# Patient Record
Sex: Male | Born: 1935 | Marital: Married | Smoking: Former smoker
Health system: Southern US, Community
[De-identification: ages and names within clinical notes are randomized; demographics above are authoritative.]

## PROBLEM LIST (undated history)

## (undated) DIAGNOSIS — G8929 Other chronic pain: Secondary | ICD-10-CM

## (undated) DIAGNOSIS — M1990 Unspecified osteoarthritis, unspecified site: Secondary | ICD-10-CM

## (undated) DIAGNOSIS — I471 Supraventricular tachycardia, unspecified: Secondary | ICD-10-CM

## (undated) DIAGNOSIS — N3941 Urge incontinence: Secondary | ICD-10-CM

## (undated) DIAGNOSIS — J301 Allergic rhinitis due to pollen: Secondary | ICD-10-CM

## (undated) DIAGNOSIS — M51369 Other intervertebral disc degeneration, lumbar region without mention of lumbar back pain or lower extremity pain: Secondary | ICD-10-CM

## (undated) DIAGNOSIS — M7700 Medial epicondylitis, unspecified elbow: Secondary | ICD-10-CM

## (undated) DIAGNOSIS — R2689 Other abnormalities of gait and mobility: Secondary | ICD-10-CM

## (undated) DIAGNOSIS — G4700 Insomnia, unspecified: Secondary | ICD-10-CM

## (undated) DIAGNOSIS — E785 Hyperlipidemia, unspecified: Secondary | ICD-10-CM

## (undated) DIAGNOSIS — N400 Enlarged prostate without lower urinary tract symptoms: Secondary | ICD-10-CM

## (undated) DIAGNOSIS — M5136 Other intervertebral disc degeneration, lumbar region: Secondary | ICD-10-CM

## (undated) DIAGNOSIS — M4306 Spondylolysis, lumbar region: Secondary | ICD-10-CM

## (undated) DIAGNOSIS — I10 Essential (primary) hypertension: Secondary | ICD-10-CM

## (undated) HISTORY — PX: BACK SURGERY: SHX140

## (undated) HISTORY — PX: CAROTID STENT: SHX1301

## (undated) HISTORY — DX: Hyperlipidemia, unspecified: E78.5

## (undated) HISTORY — DX: Essential (primary) hypertension: I10

---

## 2008-09-05 DIAGNOSIS — I2510 Atherosclerotic heart disease of native coronary artery without angina pectoris: Secondary | ICD-10-CM

## 2008-09-05 HISTORY — DX: Atherosclerotic heart disease of native coronary artery without angina pectoris: I25.10

## 2009-11-20 DIAGNOSIS — I5189 Other ill-defined heart diseases: Secondary | ICD-10-CM

## 2009-11-20 HISTORY — DX: Other ill-defined heart diseases: I51.89

## 2010-10-05 DIAGNOSIS — E782 Mixed hyperlipidemia: Secondary | ICD-10-CM | POA: Insufficient documentation

## 2010-10-05 DIAGNOSIS — I471 Supraventricular tachycardia, unspecified: Secondary | ICD-10-CM | POA: Insufficient documentation

## 2010-10-05 DIAGNOSIS — I2510 Atherosclerotic heart disease of native coronary artery without angina pectoris: Secondary | ICD-10-CM | POA: Insufficient documentation

## 2011-07-31 DIAGNOSIS — I10 Essential (primary) hypertension: Secondary | ICD-10-CM | POA: Insufficient documentation

## 2012-09-10 DIAGNOSIS — M7710 Lateral epicondylitis, unspecified elbow: Secondary | ICD-10-CM | POA: Insufficient documentation

## 2013-10-14 DIAGNOSIS — G8929 Other chronic pain: Secondary | ICD-10-CM | POA: Insufficient documentation

## 2013-12-06 DIAGNOSIS — M5126 Other intervertebral disc displacement, lumbar region: Secondary | ICD-10-CM | POA: Insufficient documentation

## 2014-02-14 DIAGNOSIS — M5116 Intervertebral disc disorders with radiculopathy, lumbar region: Secondary | ICD-10-CM | POA: Insufficient documentation

## 2015-07-01 DIAGNOSIS — M19041 Primary osteoarthritis, right hand: Secondary | ICD-10-CM | POA: Insufficient documentation

## 2018-02-05 DIAGNOSIS — M5126 Other intervertebral disc displacement, lumbar region: Secondary | ICD-10-CM | POA: Insufficient documentation

## 2019-07-03 DIAGNOSIS — M7918 Myalgia, other site: Secondary | ICD-10-CM | POA: Insufficient documentation

## 2019-07-03 DIAGNOSIS — M461 Sacroiliitis, not elsewhere classified: Secondary | ICD-10-CM | POA: Insufficient documentation

## 2020-01-16 ENCOUNTER — Other Ambulatory Visit: Payer: Self-pay | Admitting: Nurse Practitioner

## 2020-01-16 DIAGNOSIS — G8929 Other chronic pain: Secondary | ICD-10-CM

## 2020-01-29 ENCOUNTER — Ambulatory Visit: Payer: Self-pay | Admitting: Internal Medicine

## 2020-02-05 ENCOUNTER — Ambulatory Visit: Payer: Self-pay | Admitting: Internal Medicine

## 2020-02-07 ENCOUNTER — Ambulatory Visit
Admission: RE | Admit: 2020-02-07 | Discharge: 2020-02-07 | Disposition: A | Discharge status: Home / Self Care | Payer: Medicare HMO | Source: Ambulatory Visit | Attending: Nurse Practitioner | Admitting: Nurse Practitioner

## 2020-02-07 ENCOUNTER — Other Ambulatory Visit: Payer: Self-pay

## 2020-02-07 DIAGNOSIS — M545 Low back pain, unspecified: Secondary | ICD-10-CM | POA: Insufficient documentation

## 2020-02-07 DIAGNOSIS — G8929 Other chronic pain: Secondary | ICD-10-CM | POA: Diagnosis present

## 2020-06-14 NOTE — Progress Notes (Signed)
Cardiology Office Note  Date:  06/16/2020   ID:  Kenson, Groh [birth year], MRN [id number]  PCP:  [facility]   Chief Complaint  Patient presents with  . New Patient (Initial Visit)    Establish care with provider for hypertension. Just moved from [facility] and need a cardiologist closer to them. Medications verbally reviewed with patient.     HPI:  [name] is a 87 yo male with PMH of CAD  09/05/2008: Cardiac cath [facility]). EF 78%, 95% prox LAD, 95% mid LAD. RV marginal 90%. Status post successful PCI to prox/mid LAD with two overlapping DES.  hyperlipidemia hypertension.  back pain Who presents to establish care in the [place] office for his coronary disease, prior stenting  Discussed prior coronary intervention in 2010 Denies any further catheterization since that time Records from [facility] requested, reviewed in detail  Active, yoga 3x a week, stretch 2x a week Gardening Muscle building during the week  Denies any anginal symptoms with activity  Lives with his wife, she has cardiac disease and is going to establish in our office Presents today with his daughter Lives at [facility]  Notes indicating fluctuating Lipitor dose 20-40 On the 40 mg it would appear LDL 50, on the Lipitor 20 it would appear LDL 70  Remote stress test   echocardiogram was done in 2011 that shows normal ejection fraction.  EKG personally reviewed by myself on todays visit Shows sinus bradycardia rate 54 bpm no significant ST-T wave changes  Other past medical history reviewed a. 09/03/2008: Exercise treadmill test. EKG positive in inferior/anterolateral leads with ST elevation in AVR, V1. Reproduced 08/2008 chest pain. Max HR 126, 10.1 METS.  b. 09/05/2008: Cardiac cath [facility]). EF 78%, 95% prox LAD, 95% mid LAD. RV marginal 90%. Status post successful PCI to prox/mid LAD with two overlapping DES.  c. Completed three month cardiac rehab [facility]  d. 01/26/2009:  Exercise treadmill test [facility]). Negative adequate to max HR 133 bpm, 13.1 METS. No chest pain or arrhythmias.  D. 11/2009: ECHO: EF > 55%, trivial valvular disease  a. 01/06/2006:TC 245, TG 49, HDL 66, LDL 169.2  b. 02/02/2009: TC 143, TG 44, HDL 70, LDL 64  c. 11/16/2009: TC 148, TG 102, HDL 66, LDL 61  d. 08/17/10: TC 158, TG 49, HDL 75, LDL 73. E. 08/01/2011: TC 161, Tg 62, HDL 67, LDL 82 on Atorvastatin 40mg m F. April 2014 TC 161, TG 62, HDL 67, LDL 82 - Lipitor 20mg  G. 2017 TC 141, TG 89, HDL 49, LDL 74 - Lipitor 20mg  H. 2018 TC 151, TG 152, HDL 64, LDL 57 - Lipitor 20mg  I. 2020 TC 162, TG 84, HDL 82, LDL 82 - Lipitor 20mg   PMH:   has a past medical history of Hyperlipidemia and Hypertension.  PSH:    Past Surgical History:  Procedure Laterality Date  . BACK SURGERY    . CAROTID STENT     09/05/2008    Current Outpatient Medications  Medication Sig Dispense Refill  . aspirin 81 MG EC tablet Take by mouth.    2018 atorvastatin (LIPITOR) 20 MG tablet Take by mouth.    ibuprofen (ADVIL) 800 MG tablet Take by mouth.    lisinopril (ZESTRIL) 20 MG tablet Take 20 mg by mouth daily.    . metoprolol succinate (TOPROL-XL) 25 MG 24 hr tablet Take 1 tablet by mouth daily.    . nitroGLYCERIN (NITROSTAT) 0.4 MG SL  tablet 0.4 mg.     No current facility-administered medications for this visit.     Allergies:   Levonorgestrel-ethinyl estrad   Social History:  The patient  reports that he has quit smoking. He has never used smokeless tobacco.   Family History:   family history is not on file.    Review of Systems: Review of Systems  Constitutional: Negative.   HENT: Negative.   Respiratory: Negative.   Cardiovascular: Negative.   Gastrointestinal: Negative.   Musculoskeletal: Negative.   Neurological: Negative.   Psychiatric/Behavioral: Negative.   All other systems reviewed and are negative.   PHYSICAL EXAM: VS:  BP 124/84 (BP Location: Left Arm, Patient Position: Sitting,  Cuff Size: Normal)   Pulse (!) 54   Ht 5\' 8"  (1.727 m)   Wt 166 lb (75.3 kg)   SpO2 96%   BMI 25.24 kg/m  , BMI Body mass index is 25.24 kg/m. GEN: Well nourished, well developed, in no acute distress HEENT: normal Neck: no JVD, carotid bruits, or masses Cardiac: RRR; no murmurs, rubs, or gallops,no edema  Respiratory:  clear to auscultation bilaterally, normal work of breathing GI: soft, nontender, nondistended, + BS MS: no deformity or atrophy Skin: warm and dry, no rash Neuro:  Strength and sensation are intact Psych: euthymic mood, full affect   Recent Labs: No results found for requested labs within last 8760 hours.    Lipid Panel No results found for: CHOL, HDL, LDLCALC, TRIG    Wt Readings from Last 3 Encounters:  06/16/20 166 lb (75.3 kg)     ASSESSMENT AND PLAN:  Problem List Items Addressed This Visit      Cardiology Problems   Coronary artery disease involving native coronary artery of native heart - Primary   Relevant Medications   aspirin 81 MG EC tablet   atorvastatin (LIPITOR) 20 MG tablet   lisinopril (ZESTRIL) 20 MG tablet   metoprolol succinate (TOPROL-XL) 25 MG 24 hr tablet   nitroGLYCERIN (NITROSTAT) 0.4 MG SL tablet   Mixed hyperlipidemia   Relevant Medications   aspirin 81 MG EC tablet   atorvastatin (LIPITOR) 20 MG tablet   lisinopril (ZESTRIL) 20 MG tablet   metoprolol succinate (TOPROL-XL) 25 MG 24 hr tablet   nitroGLYCERIN (NITROSTAT) 0.4 MG SL tablet    Other Visit Diagnoses    Benign essential HTN       Relevant Medications   aspirin 81 MG EC tablet   atorvastatin (LIPITOR) 20 MG tablet   lisinopril (ZESTRIL) 20 MG tablet   metoprolol succinate (TOPROL-XL) 25 MG 24 hr tablet   nitroGLYCERIN (NITROSTAT) 0.4 MG SL tablet   Chronic back pain, unspecified back location, unspecified back pain laterality       Relevant Medications   aspirin 81 MG EC tablet   ibuprofen (ADVIL) 800 MG tablet     Coronary artery disease with stable  angina  Prior cardiac history reviewed, denies angina Very active at baseline Recommend he research Zetia to achieve goal LDL well below 70 Appears previously was on Lipitor 40 with LDL in the 50s, now only on Lipitor 20, long discussion with him and his daughter He is inclined not to do more medications, would prefer less medication  Essential hypertension Blood pressure is well controlled on today's visit. No changes made to the medications.  Hyperlipidemia As above will continue Lipitor 20, consider high-dose Lipitor or adding Zetia in the future     Total encounter time more than 60 minutes  Greater than 50% was spent in counseling and coordination of care with the patient    Signed, Esmond Plants, M.D., Ph.D. [place], [place]

## 2020-06-16 ENCOUNTER — Other Ambulatory Visit: Payer: Self-pay

## 2020-06-16 ENCOUNTER — Ambulatory Visit: Payer: Medicare HMO | Admitting: Cardiovascular Disease

## 2020-06-16 ENCOUNTER — Encounter: Payer: Self-pay | Admitting: Cardiovascular Disease

## 2020-06-16 VITALS — BP 124/84 | HR 54 | Ht 68.0 in | Wt 166.0 lb

## 2020-06-16 DIAGNOSIS — I25118 Atherosclerotic heart disease of native coronary artery with other forms of angina pectoris: Secondary | ICD-10-CM

## 2020-06-16 DIAGNOSIS — M549 Dorsalgia, unspecified: Secondary | ICD-10-CM | POA: Diagnosis not present

## 2020-06-16 DIAGNOSIS — E782 Mixed hyperlipidemia: Secondary | ICD-10-CM | POA: Diagnosis not present

## 2020-06-16 DIAGNOSIS — I10 Essential (primary) hypertension: Secondary | ICD-10-CM

## 2020-06-16 DIAGNOSIS — G8929 Other chronic pain: Secondary | ICD-10-CM

## 2020-06-16 MED ORDER — ATORVASTATIN CALCIUM 20 MG PO TABS
20.0000 mg | ORAL_TABLET | Freq: Every day | ORAL | 4 refills | Status: DC
Start: 2020-06-16 — End: 2020-12-01

## 2020-06-16 MED ORDER — METOPROLOL SUCCINATE ER 25 MG PO TB24: 25.0000 mg | ORAL_TABLET | Freq: Every day | ORAL | 4 refills | Status: DC

## 2020-06-16 MED ORDER — LISINOPRIL 20 MG PO TABS: 20.0000 mg | ORAL_TABLET | Freq: Every day | ORAL | 4 refills | Status: DC

## 2020-06-16 NOTE — Patient Instructions (Signed)
Research Zetia/ezetimibe   Medication Instructions:  No changes  If you need a refill on your cardiac medications before your next appointment, please call your pharmacy.    Lab work: No new labs needed   If you have labs (blood work) drawn today and your tests are completely normal, you will receive your results only by: Marland Kitchen MyChart Message (if you have MyChart) OR . A paper copy in the mail If you have any lab test that is abnormal or we need to change your treatment, we will call you to review the results.   Testing/Procedures: No new testing needed   Follow-Up: At [facility], you and your health needs are our priority.  As part of our continuing mission to provide you with exceptional heart care, we have created designated Provider Care Teams.  These Care Teams include your primary Cardiologist (physician) and Advanced Practice Providers (APPs -  Physician Assistants and Nurse Practitioners) who all work together to provide you with the care you need, when you need it.  . You will need a follow up appointment in 6 months  . Providers on your designated Care Team:   . Nicolasa Ducking, NP . Eula Listen, PA-C . Marisue Ivan, PA-C  Any Other Special Instructions Will Be Listed Below (If Applicable).  COVID-19 Vaccine Information can be found at: [email or web address] For questions related to vaccine distribution or appointments, please email [email or web address] or call [phone].

## 2020-11-30 NOTE — Progress Notes (Signed)
Cardiology Office Note  Date:  12/01/2020   ID:  Moris, Ratchford 1930-12-28, MRN [id number]  PCP:  [facility]   Chief Complaint  Patient presents with   6 month follow up     "Doing well." Medications reviewed by the patient verbally.     HPI:  [name] is a 87 yo male with PMH of CAD  09/05/2008: Cardiac cath [facility]). EF 78%, 95% prox LAD, 95% mid LAD. RV marginal 90%. Status post successful PCI to prox/mid LAD with two overlapping DES.  hyperlipidemia hypertension.  back pain Who presents for f/u of his coronary disease, prior stenting  "Bad back", chronic pain issues Poor balance Lives at [facility] Active, yoga 3x a week, stretch 2x a week Gardening Muscle building during the week  Daughter on the phone with him today, she lives in [place] Presents with his wife  Denies is any shortness of breath or chest pain on exertion  coronary intervention in 2010 No trouble since that time he reports   on Lipitor 40 daily Cholesterol at goal, total cholesterol 149 LDL 60  Remote stress test   echocardiogram was done in 2011 that shows normal ejection fraction.  EKG personally reviewed by myself on todays visit Shows sinus bradycardia rate 90 bpm no significant ST-T wave changes  Other past medical history reviewed a. 09/03/2008: Exercise treadmill test. EKG positive in inferior/anterolateral leads with ST elevation in AVR, V1. Reproduced 08/2008 chest pain. Max HR 126, 10.1 METS.  b. 09/05/2008: Cardiac cath [facility]). EF 78%, 95% prox LAD, 95% mid LAD. RV marginal 90%. Status post successful PCI to prox/mid LAD with two overlapping DES.  c. Completed three month cardiac rehab [place] at [facility]  d. 01/26/2009: Exercise treadmill test [facility]). Negative adequate to max HR 133 bpm, 13.1 METS. No chest pain or arrhythmias.  D. 11/2009: ECHO: EF > 55%, trivial valvular disease  a. 01/06/2006:TC 245, TG 49, HDL 66, LDL 169.2  b. 02/02/2009: TC 143, TG 44,  HDL 70, LDL 64  c. 11/16/2009: TC 148, TG 102, HDL 66, LDL 61  d. 08/17/10: TC 158, TG 49, HDL 75, LDL 73. E. 08/01/2011: TC 161, Tg 62, HDL 67, LDL 82 on Atorvastatin 40mg m F. April 2014 TC 161, TG 62, HDL 67, LDL 82 - Lipitor 20mg  G. 2017 TC 141, TG 89, HDL 49, LDL 74 - Lipitor 20mg  H. 2018 TC 151, TG 152, HDL 64, LDL 57 - Lipitor 20mg  I. 2020 TC 162, TG 84, HDL 82, LDL 82 - Lipitor 20mg   PMH:   has a past medical history of Hyperlipidemia and Hypertension.  PSH:    Past Surgical History:  Procedure Laterality Date   BACK SURGERY     CAROTID STENT     09/05/2008    Current Outpatient Medications  Medication Sig Dispense Refill   aspirin 81 MG EC tablet Take by mouth.     atorvastatin (LIPITOR) 20 MG tablet Take 1 tablet (20 mg total) by mouth daily. 90 tablet 4   ibuprofen (ADVIL) 800 MG tablet Take by mouth.     lisinopril (ZESTRIL) 20 MG tablet Take 1 tablet (20 mg total) by mouth daily. 90 tablet 4   metoprolol succinate (TOPROL-XL) 25 MG 24 hr tablet Take 1 tablet (25 mg total) by mouth daily. 90 tablet 4   nitroGLYCERIN (NITROSTAT) 0.4 MG SL tablet 0.4 mg.     No current facility-administered medications for this visit.  Allergies:   Levonorgestrel-ethinyl estrad   Social History:  The patient  reports that he has quit smoking. He has never used smokeless tobacco.   Family History:   family history is not on file.    Review of Systems: Review of Systems  Constitutional: Negative.   HENT: Negative.    Respiratory: Negative.    Cardiovascular: Negative.   Gastrointestinal: Negative.   Musculoskeletal: Negative.   Neurological: Negative.   Psychiatric/Behavioral: Negative.    All other systems reviewed and are negative.  PHYSICAL EXAM: VS:  BP 122/80 (BP Location: Left Arm, Patient Position: Sitting, Cuff Size: Normal)   Pulse 90   Ht 5\' 9"  (1.753 m)   Wt 164 lb (74.4 kg)   SpO2 98%   BMI 24.22 kg/m  , BMI Body mass index is 24.22 kg/m. Constitutional:   oriented to person, place, and time. No distress.  HENT:  Head: Grossly normal Eyes:  no discharge. No scleral icterus.  Neck: No JVD, no carotid bruits  Cardiovascular: Regular rate and rhythm, no murmurs appreciated Pulmonary/Chest: Clear to auscultation bilaterally, no wheezes or rails Abdominal: Soft.  no distension.  no tenderness.  Musculoskeletal: Normal range of motion Neurological:  normal muscle tone. Coordination normal. No atrophy Skin: Skin warm and dry Psychiatric: normal affect, pleasant   Recent Labs: No results found for requested labs within last 8760 hours.    Lipid Panel No results found for: CHOL, HDL, LDLCALC, TRIG    Wt Readings from Last 3 Encounters:  12/01/20 164 lb (74.4 kg)  06/16/20 166 lb (75.3 kg)     ASSESSMENT AND PLAN:  Problem List Items Addressed This Visit       Cardiology Problems   Coronary artery disease involving native coronary artery of native heart - Primary   Relevant Orders   EKG 12-Lead   Mixed hyperlipidemia   Other Visit Diagnoses     Benign essential HTN       Relevant Orders   EKG 12-Lead     Coronary artery disease with stable angina  Prior cardiac history reviewed, denies angina active at baseline Currently with no symptoms of angina. No further workup at this time. Continue current medication regimen. Recommended continued exercise at [facility]  Essential hypertension Blood pressure is well controlled on today's visit. No changes made to the medications.  Hyperlipidemia Cholesterol is at goal on the current lipid regimen. No changes to the medications were made.  Today's visit discussed with daughter on the phone from [facility]  Total encounter time more than 25 minutes  Greater than 50% was spent in counseling and coordination of care with the patient    Signed, [facility], M.D., Ph.D. [facility] [place], [place]

## 2020-12-01 ENCOUNTER — Encounter: Payer: Self-pay | Admitting: Cardiovascular Disease

## 2020-12-01 ENCOUNTER — Ambulatory Visit: Payer: Medicare HMO | Admitting: Cardiovascular Disease

## 2020-12-01 ENCOUNTER — Other Ambulatory Visit: Payer: Self-pay

## 2020-12-01 VITALS — BP 122/80 | HR 90 | Ht 69.0 in | Wt 164.0 lb

## 2020-12-01 DIAGNOSIS — I10 Essential (primary) hypertension: Secondary | ICD-10-CM

## 2020-12-01 DIAGNOSIS — I25118 Atherosclerotic heart disease of native coronary artery with other forms of angina pectoris: Secondary | ICD-10-CM

## 2020-12-01 DIAGNOSIS — E782 Mixed hyperlipidemia: Secondary | ICD-10-CM

## 2020-12-01 MED ORDER — LISINOPRIL 20 MG PO TABS: 20.0000 mg | ORAL_TABLET | Freq: Every day | ORAL | 3 refills | Status: DC

## 2020-12-01 MED ORDER — METOPROLOL SUCCINATE ER 25 MG PO TB24: 25.0000 mg | ORAL_TABLET | Freq: Every day | ORAL | 3 refills | Status: DC

## 2020-12-01 MED ORDER — ATORVASTATIN CALCIUM 20 MG PO TABS: 20.0000 mg | ORAL_TABLET | Freq: Every day | ORAL | 3 refills | Status: DC

## 2020-12-01 MED ORDER — NITROGLYCERIN 0.4 MG SL SUBL: SUBLINGUAL_TABLET | SUBLINGUAL | 3 refills | Status: DC

## 2020-12-01 NOTE — Patient Instructions (Addendum)
Medication Instructions:  No changes  If you need a refill on your cardiac medications before your next appointment, please call your pharmacy.   Lab work: No new labs needed  Testing/Procedures: No new testing needed  Follow-Up: At [facility], you and your health needs are our priority.  As part of our continuing mission to provide you with exceptional heart care, we have created designated Provider Care Teams.  These Care Teams include your primary Cardiologist (physician) and Advanced Practice Providers (APPs -  Physician Assistants and Nurse Practitioners) who all work together to provide you with the care you need, when you need it.  You will need a follow up appointment in 12 months  Providers on your designated Care Team:   Christopher Berge, NP Ryan Dunn, PA-C Jacquelyn Visser, PA-C Cadence Furth, PA-C  COVID-19 Vaccine Information can be found at: [email or web address] For questions related to vaccine distribution or appointments, please email [email or web address] or call [phone].    

## 2020-12-04 ENCOUNTER — Telehealth: Payer: Self-pay

## 2020-12-04 NOTE — Telephone Encounter (Signed)
[facility] sent fax directions for Nitroglycerin Rx because there weren't any included on the Rx). Spoke with patient to ask him which local pharmacy he would like his Rx for Nitroglycerin to be sent to however patient states he does not want to get this medication filled. Explained to patient that this medication is used on an emergency basis for chest pain but he still declined. Rx cancelled through [facility] via fax.

## 2021-06-27 ENCOUNTER — Other Ambulatory Visit: Payer: Self-pay

## 2021-06-27 ENCOUNTER — Emergency Department: Payer: Medicare HMO

## 2021-06-27 ENCOUNTER — Encounter: Payer: Self-pay | Admitting: Emergency Medicine

## 2021-06-27 ENCOUNTER — Emergency Department
Admission: EM | Admit: 2021-06-27 | Discharge: 2021-06-27 | Disposition: A | Discharge status: Home / Self Care | Payer: Medicare HMO | Attending: Emergency Medicine | Admitting: Emergency Medicine

## 2021-06-27 DIAGNOSIS — I10 Essential (primary) hypertension: Secondary | ICD-10-CM | POA: Insufficient documentation

## 2021-06-27 DIAGNOSIS — R350 Frequency of micturition: Secondary | ICD-10-CM | POA: Insufficient documentation

## 2021-06-27 DIAGNOSIS — I2510 Atherosclerotic heart disease of native coronary artery without angina pectoris: Secondary | ICD-10-CM | POA: Insufficient documentation

## 2021-06-27 DIAGNOSIS — R531 Weakness: Secondary | ICD-10-CM | POA: Insufficient documentation

## 2021-06-27 LAB — CBC WITH DIFFERENTIAL/PLATELET
Abs Immature Granulocytes: 0.03 10*3/uL (ref 0.00–0.07)
Basophils Absolute: 0 10*3/uL (ref 0.0–0.1)
Basophils Relative: 0 %
Eosinophils Absolute: 0 10*3/uL (ref 0.0–0.5)
Eosinophils Relative: 1 %
HCT: 43.3 % (ref 39.0–52.0)
Hemoglobin: 14.5 g/dL (ref 13.0–17.0)
Immature Granulocytes: 0 %
Lymphocytes Relative: 21 %
Lymphs Abs: 1.7 10*3/uL (ref 0.7–4.0)
MCH: 30.9 pg (ref 26.0–34.0)
MCHC: 33.5 g/dL (ref 30.0–36.0)
MCV: 92.3 fL (ref 80.0–100.0)
Monocytes Absolute: 0.5 10*3/uL (ref 0.1–1.0)
Monocytes Relative: 6 %
Neutro Abs: 5.8 10*3/uL (ref 1.7–7.7)
Neutrophils Relative %: 72 %
Platelets: 206 10*3/uL (ref 150–400)
RBC: 4.69 MIL/uL (ref 4.22–5.81)
RDW: 11.4 % — ABNORMAL LOW (ref 11.5–15.5)
WBC: 8 10*3/uL (ref 4.0–10.5)
nRBC: 0 % (ref 0.0–0.2)

## 2021-06-27 LAB — URINALYSIS, COMPLETE (UACMP) WITH MICROSCOPIC
Bilirubin Urine: NEGATIVE
Glucose, UA: NEGATIVE mg/dL
Hgb urine dipstick: NEGATIVE
Ketones, ur: NEGATIVE mg/dL
Leukocytes,Ua: NEGATIVE
Nitrite: NEGATIVE
Protein, ur: NEGATIVE mg/dL
Specific Gravity, Urine: 1.015 (ref 1.005–1.030)
Squamous Epithelial / HPF: NONE SEEN (ref 0–5)
pH: 8 (ref 5.0–8.0)

## 2021-06-27 LAB — COMPREHENSIVE METABOLIC PANEL
ALT: 20 U/L (ref 0–44)
AST: 28 U/L (ref 15–41)
Albumin: 3.8 g/dL (ref 3.5–5.0)
Alkaline Phosphatase: 64 U/L (ref 38–126)
Anion gap: 8 (ref 5–15)
BUN: 17 mg/dL (ref 8–23)
CO2: 26 mmol/L (ref 22–32)
Calcium: 8.8 mg/dL — ABNORMAL LOW (ref 8.9–10.3)
Chloride: 105 mmol/L (ref 98–111)
Creatinine, Ser: 0.77 mg/dL (ref 0.61–1.24)
GFR, Estimated: >60 mL/min (ref >60)
Glucose, Bld: 117 mg/dL — ABNORMAL HIGH (ref 70–99)
Potassium: 3.7 mmol/L (ref 3.5–5.1)
Sodium: 139 mmol/L (ref 135–145)
Total Bilirubin: 1.2 mg/dL (ref 0.3–1.2)
Total Protein: 7.2 g/dL (ref 6.5–8.1)

## 2021-06-27 NOTE — ED Triage Notes (Signed)
Patient to ED from [facility] at [facility] for generalized weakness per EMS. Patient states that he does not feel weak but that he has been going to the bathroom often during the night and that made his wife nervous. Patient denies difficulties urinating or burning. Aox4. Patient states he "wants to make his wife happy" by being seen.  ?

## 2021-06-27 NOTE — ED Provider Notes (Signed)
? ?[facility] ?Provider Note ? ? ? Event Date/Time  ? First MD Initiated Contact with Patient 06/27/21 [phone]   ?  (approximate) ? ? ?History  ? ?Weakness ? ? ?HPI ? ?Dennis Bond is a 88 y.o. male with past medical history of hypertension hyperlipidemia presents with an episode of altered mental status.  Patient's wife notes that around 5 AM today he got out of bed and seemed confused.  He went and sat down in the living room couch and then urinated on himself.  Patient is adamant that he was just in and out of sleep having difficulty sleeping may have been a little bit groggy.  Does not recall urinating on himself.  Says that typically he gets up multiple times per night and has urinary urgency with small amounts of output.  Denies headache nausea vomiting chest pain shortness of breath abdominal pain earlier other dysuria.  He denies fevers.  Denies numbness tingling or weakness. ?  ? ?Past Medical History:  ?Diagnosis Date  ? Hyperlipidemia   ? Hypertension   ? ? ?Patient Active Problem List  ? Diagnosis Date Noted  ? Gluteal pain 07/03/2019  ? Sacroiliitis (HCC) 07/03/2019  ? 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 Arthritis of hand, right 07/01/2015  ? 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  ? Displacement of intervertebral disc of lumbar region 12/06/2013  ? Chronic right-sided low back pain without sciatica 10/14/2013  ? Lateral epicondylitis 09/10/2012  ? Essential hypertension 07/31/2011  ? Coronary artery disease involving native coronary artery of native heart 10/05/2010  ? Mixed hyperlipidemia 10/05/2010  ? Paroxysmal supraventricular tachycardia (HCC) 10/05/2010  ? ? ? ?Physical Exam  ?Triage Vital Signs: ?ED Triage Vitals  ?Enc Vitals Group  ?   BP 06/27/21 0927 (!) 190/91  ?   Pulse Rate 06/27/21 0927 72  ?   Resp 06/27/21 0927 18  ?   Temp 06/27/21 0927 (!) 97.5 ?F (36.4 ?C)  ?   Temp Source 06/27/21 0927 Oral  ?   SpO2 06/27/21 0927 97 %  ?    Weight 06/27/21 0928 170 lb (77.1 kg)  ?   Height 06/27/21 0928 5\' 9"  (1.753 m)  ?   Head Circumference --   ?   Peak Flow --   ?   Pain Score 06/27/21 0928 0  ?   Pain Loc --   ?   Pain Edu? --   ?   Excl. in GC? --   ? ? ?Most recent vital signs: ?Vitals:  ? 06/27/21 1030 06/27/21 1100  ?BP: (!) 160/80 (!) 151/94  ?Pulse: 68 65  ?Resp: 20 15  ?Temp:    ?SpO2: 98% 97%  ? ? ? ?General: Awake, no distress.  ?CV:  Good peripheral perfusion.  ?Resp:  Normal effort.  ?Abd:  No distention.  Abdomen soft and nontender throughout ?Neuro:             Awake, Alert, Oriented x 3  ?Other:  Aox3, nml speech  ?PERRL, EOMI, face symmetric, nml tongue movement  ?5/5 strength in the BL upper and lower extremities  ?Sensation grossly intact in the BL upper and lower extremities  ?Finger-nose-finger intact BL ? ? ? ?ED Results / Procedures / Treatments  ?Labs ?(all labs ordered are listed, but only abnormal results are displayed) ?Labs Reviewed  ?URINALYSIS, COMPLETE (UACMP) WITH MICROSCOPIC - Abnormal; Notable for the following components:  ?    Result Value  ?  Color, Urine YELLOW (*)   ? APPearance CLEAR (*)   ? Bacteria, UA RARE (*)   ? All other components within normal limits  ?COMPREHENSIVE METABOLIC PANEL - Abnormal; Notable for the following components:  ? Glucose, Bld 117 (*)   ? Calcium 8.8 (*)   ? All other components within normal limits  ?CBC WITH DIFFERENTIAL/PLATELET - Abnormal; Notable for the following components:  ? RDW 11.4 (*)   ? All other components within normal limits  ? ? ? ?EKG ? ?EKG interpretation performed by myself: NSR, nml axis, nml intervals, no acute ischemic changes ? ? ? ?RADIOLOGY ?I reviewed the CT scan of the brain which does not show any acute intracranial process; agree with radiology report  ? ? ? ?PROCEDURES: ? ?Critical Care performed: No ? ?.1-3 Lead EKG Interpretation ?Performed by: Georga Hacking, MD ?Authorized by: Georga Hacking, MD  ? ?  Interpretation: normal   ?  ECG rate  assessment: normal   ?  Rhythm: sinus rhythm   ?  Ectopy: none   ?  Conduction: normal   ? ?The patient is on the cardiac monitor to evaluate for evidence of arrhythmia and/or significant heart rate changes. ? ? ?MEDICATIONS ORDERED IN ED: ?Medications - No data to display ? ? ?IMPRESSION / MDM / ASSESSMENT AND PLAN / ED COURSE  ?I reviewed the triage vital signs and the nursing notes. ?             ?               ? ?Differential diagnosis includes, but is not limited to, metabolic encephalopathy, UTI, hypertensive encephalopathy less likely, CVA ? ?Patient is an 88 year old male who presents after an episode of delirium today.  His wife tells me that he woke up around 5 AM walked to the couch which is not usual for him and then urinated on himself.  Patient endorsing significant symptoms of BPH with multiple nighttime awakening to urinate and small outputs.  He is adamant that he is back to baseline denies any acute neurologic symptoms and he is alert and oriented on exam and looks quite well with a nonfocal neurologic exam.  Somewhat hypertensive here but my suspicion for hypertensive encephalopathy is low given his normal mental status.  Will rule out UTI check basic labs and a CT head given his age.  Anticipate that if all within normal limits he could be discharged. ? ?Patient's labs including CBC CMP and UA are all reassuring.  No evidence of UTI.  CT head is without acute abnormality EKG nonischemic.  Given patient is back to baseline without any abnormality on neurologic exam I think he is appropriate for discharge. ? ?  ? ? ?FINAL CLINICAL IMPRESSION(S) / ED DIAGNOSES  ? ?Final diagnoses:  ?Urinary frequency  ? ? ? ?Rx / DC Orders  ? ?ED Discharge Orders   ? ? None  ? ?  ? ? ? ?Note:  This document was prepared using Dragon voice recognition software and may include unintentional dictation errors. ?  ?Georga Hacking, MD ?06/27/21 1115 ? ?

## 2021-06-27 NOTE — ED Notes (Signed)
Sidney Ace, MD at bedside assessing patient. ?

## 2021-06-27 NOTE — Discharge Instructions (Addendum)
Your blood work, urinary sample and CT of your brain were all reassuring.  You do not have a UTI.  Please follow-up with your primary care provider regarding your frequent urination as you likely have enlargement of your prostate.  There are medications that they could potentially start you on to help with this. ?

## 2021-08-03 ENCOUNTER — Ambulatory Visit: Payer: Medicare HMO | Admitting: Urology

## 2021-08-03 ENCOUNTER — Encounter: Payer: Self-pay | Admitting: Urology

## 2021-08-03 VITALS — BP 112/74 | HR 73 | Ht 69.0 in | Wt 170.0 lb

## 2021-08-03 DIAGNOSIS — Z125 Encounter for screening for malignant neoplasm of prostate: Secondary | ICD-10-CM | POA: Diagnosis not present

## 2021-08-03 DIAGNOSIS — N3941 Urge incontinence: Secondary | ICD-10-CM

## 2021-08-03 DIAGNOSIS — N138 Other obstructive and reflux uropathy: Secondary | ICD-10-CM

## 2021-08-03 DIAGNOSIS — N401 Enlarged prostate with lower urinary tract symptoms: Secondary | ICD-10-CM

## 2021-08-03 LAB — URINALYSIS, COMPLETE
Bilirubin, UA: NEGATIVE
Glucose, UA: NEGATIVE
Leukocytes,UA: NEGATIVE
Nitrite, UA: NEGATIVE
RBC, UA: NEGATIVE
Specific Gravity, UA: >1.03 — ABNORMAL HIGH (ref 1.005–1.030)
Urobilinogen, Ur: 1 mg/dL (ref 0.2–1.0)
pH, UA: 5.5 (ref 5.0–7.5)

## 2021-08-03 LAB — MICROSCOPIC EXAMINATION

## 2021-08-03 LAB — BLADDER SCAN AMB NON-IMAGING: Scan Result: 14

## 2021-08-03 NOTE — Progress Notes (Signed)
? ?08/03/2021 ?2:54 PM  ? ?Hilliard Borges Breunig ?Oct 27, 1930 ?[id number] ? ?Referring provider:  ?Mick Sell, MD ?[address] ?[place],  [place] [address] ?Chief Complaint  ?Patient presents with  ? Urinary Incontinence  ? ? ? ? ?HPI: ?Dennis Bond is a 88 y.o.male who presents today for further evaluation of urge incontinence.  ? ?He was seen in the ED on 06/27/2021. He was noted to have urinary accidents and confusion.UA was unremarkable besides some rare bacteria. Creatinine was normal at 0.77. He was not found to have a UTI and all labs were reassuring.  ? ?He  has documented when he drinks and how much he urinates and has brought it with today (see media). He reports that he usually wakes up to urinate and has 10-15 drops of urine.  He reports that he has urinary accidents when he has urinary urgency.  ? ?He denies seeing urologist in the past and medicine for his prostate. He reports his PCP prescribed oxybutynin but he did not finish the prescription and it had no improvement of his symptoms. His symptoms have been ongoing for about a year.  ? ?IPSS as below.   ? ? IPSS   ? ? Row Name 08/03/21 1300  ?  ?  ?  ? International Prostate Symptom Score  ? How often have you had the sensation of not emptying your bladder? Not at All    ? How often have you had to urinate less than every two hours? About half the time    ? How often have you found you stopped and started again several times when you urinated? About half the time    ? How often have you found it difficult to postpone urination? Less than half the time    ? How often have you had a weak urinary stream? More than half the time    ? How often have you had to strain to start urination? Not at All    ? How many times did you typically get up at night to urinate? 2 Times    ? Total IPSS Score 14    ?  ? Quality of Life due to urinary symptoms  ? If you were to spend the rest of your life with your urinary condition just the way it is now how would you  feel about that? Mostly Disatisfied    ? ?  ?  ? ?  ? ? ?Score:  ?1-7 Mild ?8-19 Moderate ?20-35 Severe ? ? ? ?PMH: ?Past Medical History:  ?Diagnosis Date  ? Hyperlipidemia   ? Hypertension   ? ? ?Surgical History: ?Past Surgical History:  ?Procedure Laterality Date  ? BACK SURGERY    ? CAROTID STENT    ? 09/05/2008  ? ? ?Home Medications:  ?Allergies as of 08/03/2021   ? ?   Reactions  ? Levonorgestrel-ethinyl Estrad Other (See Comments)  ? Spring allergy.  ? ?  ? ?  ?Medication List  ?  ? ?  ? Accurate as of Aug 03, 2021  2:54 PM. If you have any questions, ask your nurse or doctor.  ?  ?  ? ?  ? ?STOP taking these medications   ? ?nitroGLYCERIN 0.4 MG SL tablet ?Commonly known as: NITROSTAT ?Stopped by: Vanna Scotland, MD ?  ? ?  ? ?TAKE these medications   ? ?aspirin 81 MG EC tablet ?Take by mouth. ?  ?atorvastatin 20 MG tablet ?Commonly known as: LIPITOR ?Take  1 tablet (20 mg total) by mouth daily. ?  ?ibuprofen 800 MG tablet ?Commonly known as: ADVIL ?Take by mouth. ?  ?lisinopril 20 MG tablet ?Commonly known as: ZESTRIL ?Take 1 tablet (20 mg total) by mouth daily. ?  ?metoprolol succinate 25 MG 24 hr tablet ?Commonly known as: TOPROL-XL ?Take 1 tablet (25 mg total) by mouth daily. ?  ? ?  ? ? ?Allergies:  ?Allergies  ?Allergen Reactions  ? Levonorgestrel-Ethinyl Estrad Other (See Comments)  ?  Spring allergy.  ? ? ?Family History: ?No family history on file. ? ?Social History:  reports that he has quit smoking. He has never used smokeless tobacco. No history on file for alcohol use and drug use. ? ? ?Physical Exam: ?BP 112/74   Pulse 73   Ht 5\' 9"  (1.753 m)   Wt 170 lb (77.1 kg)   BMI 25.10 kg/m?   ?Constitutional:  Alert and oriented, No acute distress. ?HEENT: [place] AT, moist mucus membranes.  Trachea midline, no masses. ?Cardiovascular: No clubbing, cyanosis, or edema. ?Respiratory: Normal respiratory effort, no increased work of breathing. ?Skin: No rashes, bruises or suspicious lesions. ?Neurologic:  Grossly intact, no focal deficits, moving all 4 extremities. ?Psychiatric: Normal mood and affect. ? ?Laboratory Data: ? ?Lab Results  ?Component Value Date  ? CREATININE 0.77 06/27/2021  ? ? ?Urinalysis ?Unremarkable  ? ?Pertinent Imaging: ?Results for orders placed or performed in visit on 08/03/21  ?BLADDER SCAN AMB NON-IMAGING  ?Result Value Ref Range  ? Scan Result 14   ? ? ? ?Assessment & Plan:   ? ?BPH with urinary frequency/ urgency ?- We discussed different medication for symptoms such as flomax. We discussed that this medication can cause dizziness. We discussed the outlet procedure Urolift (he is seeing advertisements and is interested), we discussed that we would have to undergo a cystoscopy to see if he is a candidate for this procedure. He would like to proceed with cystoscopy/ TRUS rather than medications with possible side effects ?-Adequate emptying, no concern for overflow incontinence ?-Ua negative, no evidence of infection ? ?2. Prostate cancer screening  ?- He has not had PSA since 2016. Will draw PSA today primarily to rule out advanced metastatic prostate cancer as contributing factor to #1 ? ?Schedule cysto / TRUS ? ?2017 as a Tawni Millers for Neurosurgeon, MD.,have documented all relevant documentation on the behalf of Vanna Scotland, MD,as directed by  Vanna Scotland, MD while in the presence of Vanna Scotland, MD. ? ?I have reviewed the above documentation for accuracy and completeness, and I agree with the above.  ? ?Vanna Scotland, MD ? ?[facility] ?[address] ?[place], [place] ?[phone] ? ?

## 2021-08-03 NOTE — Patient Instructions (Signed)
Cystoscopy Cystoscopy is a procedure that is used to help diagnose and sometimes treat conditions that affect the lower urinary tract. The lower urinary tract includes the bladder and the urethra. The urethra is the tube that drains urine from the bladder. Cystoscopy is done using a thin, tube-shaped instrument with a light and camera at the end (cystoscope). The cystoscope may be hard or flexible, depending on the goal of the procedure. The cystoscope is inserted through the urethra, into the bladder. Cystoscopy may be recommended if you have: Urinary tract infections that keep coming back. Blood in the urine (hematuria). An inability to control when you urinate (urinary incontinence) or an overactive bladder. Unusual cells found in a urine sample. A blockage in the urethra, such as a urinary stone. Painful urination. An abnormality in the bladder found during an intravenous pyelogram (IVP) or CT scan. What are the risks? Generally, this is a safe procedure. However, problems may occur, including: Infection. Bleeding.  What happens during the procedure?  You will be given one or more of the following: A medicine to numb the area (local anesthetic). The area around the opening of your urethra will be cleaned. The cystoscope will be passed through your urethra into your bladder. Germ-free (sterile) fluid will flow through the cystoscope to fill your bladder. The fluid will stretch your bladder so that your health care provider can clearly examine your bladder walls. Your doctor will look at the urethra and bladder. The cystoscope will be removed The procedure may vary among health care providers  What can I expect after the procedure? After the procedure, it is common to have: Some soreness or pain in your urethra. Urinary symptoms. These include: Mild pain or burning when you urinate. Pain should stop within a few minutes after you urinate. This may last for up to a few days after the  procedure. A small amount of blood in your urine for several days. Feeling like you need to urinate but producing only a small amount of urine. Follow these instructions at home: General instructions Return to your normal activities as told by your health care provider.  Drink plenty of fluids after the procedure. Keep all follow-up visits as told by your health care provider. This is important. Contact a health care provider if you: Have pain that gets worse or does not get better with medicine, especially pain when you urinate lasting longer than 72 hours after the procedure. Have trouble urinating. Get help right away if you: Have blood clots in your urine. Have a fever or chills. Are unable to urinate. Summary Cystoscopy is a procedure that is used to help diagnose and sometimes treat conditions that affect the lower urinary tract. Cystoscopy is done using a thin, tube-shaped instrument with a light and camera at the end. After the procedure, it is common to have some soreness or pain in your urethra. It is normal to have blood in your urine after the procedure.  If you were prescribed an antibiotic medicine, take it as told by your health care provider.  This information is not intended to replace advice given to you by your health care provider. Make sure you discuss any questions you have with your health care provider. Document Revised: 03/06/2018 Document Reviewed: 03/06/2018 Elsevier Patient Education  2020 Elsevier Inc.   Transrectal Ultrasound  A transrectal ultrasound is a procedure that uses sound waves to create images of the prostate gland and nearby tissues. For this procedure, an ultrasound probe is placed   in the rectum. The probe sends sound waves through the wall of the rectum into the prostate gland. The prostate is a walnut-sized gland that is located below the bladder and in front of the rectum. The images show the size and shape of the prostate gland and nearby  structures. You may need this test if you have: Trouble urinating. Trouble getting your partner pregnant (infertility). An abnormal result from a prostate screening exam. Tell a health care provider about: Any allergies you have. All medicines you are taking, including vitamins, herbs, eye drops, creams, and over-the-counter medicines. Any bleeding problems you have. Any surgeries you have had. Any medical conditions you have. Any prostate infections you have had. What are the risks? Generally, this is a safe procedure. However, problems may occur, including: Discomfort during the procedure. Blood in your urine or sperm after the procedure. This may occur if a sample of tissue is taken to look at under a microscope (biopsy) during the procedure. What happens before the procedure? Your health care provider may instruct you to use an enema 1-4 hours before the procedure. Follow instructions from your health care provider about how to do the enema. Ask your health care provider about: Changing or stopping your regular medicines. This is especially important if you are taking diabetes medicines or blood thinners. Taking medicines such as aspirin and ibuprofen. These medicines can thin your blood. Do not take these medicines unless your health care provider tells you to take them. Taking over-the-counter medicines, vitamins, herbs, and supplements. What happens during the procedure? You will be asked to lie down on your left side on an exam table. You will bend your knees toward your chest. Gel will be put on a small probe that is about the width of a finger. The probe will be gently inserted into your rectum. You may have a feeling of fullness but should not feel pain. The probe will send signals to a computer that will create images. These will be displayed on a monitor that looks like a small television screen. The technician will slightly rotate the probe throughout the procedure. While  rotating the probe, he or she will view and capture images of the prostate gland and the surrounding structures from different angles. Your health care provider may take a biopsy sample of prostate tissue during the procedure. The images captured from the ultrasound will help guide the needle that is used to remove a sample of tissue. The sample will be sent to a lab for testing. The probe will be removed. The procedure may vary among health care providers and hospitals. What can I expect after the procedure? It is up to you to get the results of your procedure. Ask your health care provider, or the department that is doing the procedure, when your results will be ready. Keep all follow-up visits. This is important. Summary A transrectal ultrasound is a procedure that uses sound waves to create images of the prostate gland and nearby tissues. The images show the size and shape of the prostate gland and nearby structures. Before the procedure, ask your health care provider about changing or stopping your regular medicines. This is especially important if you are taking diabetes medicines or blood thinners. This information is not intended to replace advice given to you by your health care provider. Make sure you discuss any questions you have with your health care provider. Document Revised: 11/25/2020 Document Reviewed: 09/07/2020 Elsevier Patient Education  2023 Elsevier Inc.  

## 2021-08-04 LAB — PSA: Prostate Specific Ag, Serum: 5.1 ng/mL — ABNORMAL HIGH (ref 0.0–4.0)

## 2021-08-05 ENCOUNTER — Encounter: Payer: Self-pay | Admitting: Urology

## 2021-08-06 MED ORDER — TAMSULOSIN HCL 0.4 MG PO CAPS: 0.4000 mg | ORAL_CAPSULE | Freq: Every day | ORAL | 11 refills | Status: DC

## 2021-08-06 NOTE — Telephone Encounter (Signed)
Patient informed, sent in RX to [facility]. ?

## 2021-08-24 ENCOUNTER — Encounter: Payer: Self-pay | Admitting: Urology

## 2021-08-24 ENCOUNTER — Ambulatory Visit (INDEPENDENT_AMBULATORY_CARE_PROVIDER_SITE_OTHER): Payer: Medicare HMO | Admitting: Urology

## 2021-08-24 VITALS — BP 94/75 | HR 110 | Ht 69.0 in | Wt 170.0 lb

## 2021-08-24 DIAGNOSIS — N401 Enlarged prostate with lower urinary tract symptoms: Secondary | ICD-10-CM | POA: Diagnosis not present

## 2021-08-24 DIAGNOSIS — N3941 Urge incontinence: Secondary | ICD-10-CM

## 2021-08-24 DIAGNOSIS — R350 Frequency of micturition: Secondary | ICD-10-CM

## 2021-08-24 DIAGNOSIS — N138 Other obstructive and reflux uropathy: Secondary | ICD-10-CM | POA: Diagnosis not present

## 2021-08-24 NOTE — Progress Notes (Signed)
.    08/24/2021 CC: No chief complaint on file.    HPI: Dennis Bond is a 88 y.o. male with a personal history of BPH with urinary frequency/urgency who presents today for a diagnostic cystoscopy.   His most recent PSA on 08/04/2021 was 5.1   He is interested in UroLift for his BPH with urinary frequency.    There were no vitals filed for this visit. NED. A&Ox3.   No respiratory distress   Abd soft, NT, ND Normal phallus with bilateral descended testicles  Cystoscopy Procedure Note  Patient identification was confirmed, informed consent was obtained, and patient was prepped using Betadine solution.  Lidocaine jelly was administered per urethral meatus.     Pre-Procedure: - Inspection reveals a normal caliber ureteral meatus.  Procedure: The flexible cystoscope was introduced without difficulty - No urethral strictures/lesions are present. - Normal prostate bilobar coaptation  - Normal bladder neck - Bilateral ureteral orifices identified - Bladder mucosa  reveals no ulcers, tumors, or lesions - No bladder stones - Moderate to severe trabeculation  Retroflexion shows unremarkable   Post-Procedure: - Patient tolerated the procedure well   Prostate transrectal ultrasound sizing   Informed consent was obtained after discussing risks/benefits of the procedure.  A time out was performed to ensure correct patient identity.   Pre-Procedure: -Transrectal probe was placed without difficulty -Transrectal Ultrasound performed revealing a 37.02 gm prostate measuring 3.8  x 4.15 x 4.49 cm (length) -No significant hypoechoic or median lobe noted   Assessment/ Plan: BPH with outlet obstruction - Sequela of outlet obstruction with narrow prostate  -He has moderate improvement on Flomax .  - Continues to be interested in Urolift we discussed the risk and and the efficacy rate.  -We discussed the risks and complications of UroLift which include but are not limited to infection,  bleeding, and inadequate treatment with the Urolift procedure alone, anesthetic complications, among others.    No follow-ups on file.  I,Kailey Littlejohn,acting as a Neurosurgeon for Vanna Scotland, MD.,have documented all relevant documentation on the behalf of Vanna Scotland, MD,as directed by  Vanna Scotland, MD while in the presence of Vanna Scotland, MD.

## 2021-08-25 LAB — URINALYSIS, COMPLETE
Bilirubin, UA: NEGATIVE
Glucose, UA: NEGATIVE
Leukocytes,UA: NEGATIVE
Nitrite, UA: NEGATIVE
Protein,UA: NEGATIVE
Specific Gravity, UA: 1.025 (ref 1.005–1.030)
Urobilinogen, Ur: 0.2 mg/dL (ref 0.2–1.0)
pH, UA: 6 (ref 5.0–7.5)

## 2021-08-25 LAB — MICROSCOPIC EXAMINATION

## 2021-09-02 MED ORDER — TAMSULOSIN HCL 0.4 MG PO CAPS
0.4000 mg | ORAL_CAPSULE | Freq: Every day | ORAL | 11 refills | Status: DC
Start: 2021-09-02 — End: 2021-10-25

## 2021-09-02 NOTE — Addendum Note (Signed)
Addended by: Martha Clan on: 09/02/2021 09:35 AM   Modules accepted: Orders

## 2021-10-05 ENCOUNTER — Encounter: Payer: Self-pay | Admitting: Urology

## 2021-10-05 NOTE — Telephone Encounter (Signed)
Called patient to review symptoms-denies any new symptoms. Explained procedure, wishes to proceed with Urolift as mentioned at prior office visit. Offered office visit to discuss further, he declined. He wants to proceed-also noted he may need medications for after surgery. Advised scheduler will reach out to schedule. Voiced understanding.

## 2021-10-06 ENCOUNTER — Other Ambulatory Visit: Payer: Self-pay | Admitting: Urology

## 2021-10-06 ENCOUNTER — Telehealth: Payer: Self-pay

## 2021-10-06 DIAGNOSIS — N401 Enlarged prostate with lower urinary tract symptoms: Secondary | ICD-10-CM

## 2021-10-06 NOTE — Progress Notes (Signed)
[facility] Posting Form   Surgery Date/Time: Date: 11/01/2021  Surgeon: Dr. 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

## 2021-10-06 NOTE — Progress Notes (Signed)
Surgical Physician Order Form [facility]  * Scheduling expectation : Next Available  *Length of Case:   *Clearance needed: no  *Anticoagulation Instructions: Hold all anticoagulants  *Aspirin Instructions: Hold Aspirin  *Post-op visit Date/Instructions:  4-6 week w/PVR  *Diagnosis: BPH w/BOO  *Procedure:  Urolift     Additional orders: N/A  -Admit type: OUTpatient  -Anesthesia: MAC  -VTE Prophylaxis Standing Order SCD's       Other:   -Standing Lab Orders Per Anesthesia    Lab other: UA&Urine Culture  -Standing Test orders EKG/Chest x-ray per Anesthesia       Test other:   - Medications:  Ancef 2gm IV  -Other orders:  N/A

## 2021-10-06 NOTE — Telephone Encounter (Signed)
I spoke with Dennis Bond. We have discussed possible surgery dates and Monday August 7th, 2023 was agreed upon by all parties. Patient given information about surgery date, what to expect pre-operatively and post operatively.  We discussed that a Pre-Admission Testing office will be calling to set up the pre-op visit that will take place prior to surgery, and that these appointments are typically done over the phone with a Pre-Admissions RN.  Informed patient that our office will communicate any additional care to be provided after surgery. Patients questions or concerns were discussed during our call. Advised to call our office should there be any additional information, questions or concerns that arise. Patient verbalized understanding.

## 2021-10-18 ENCOUNTER — Encounter: Payer: Self-pay | Admitting: Urology

## 2021-10-19 ENCOUNTER — Other Ambulatory Visit: Payer: Medicare HMO

## 2021-10-20 ENCOUNTER — Other Ambulatory Visit: Payer: Medicare HMO

## 2021-10-20 DIAGNOSIS — N138 Other obstructive and reflux uropathy: Secondary | ICD-10-CM

## 2021-10-20 LAB — URINALYSIS, COMPLETE
Bilirubin, UA: NEGATIVE
Glucose, UA: NEGATIVE
Ketones, UA: NEGATIVE
Leukocytes,UA: NEGATIVE
Nitrite, UA: NEGATIVE
Protein,UA: NEGATIVE
RBC, UA: NEGATIVE
Specific Gravity, UA: 1.015 (ref 1.005–1.030)
Urobilinogen, Ur: 1 mg/dL (ref 0.2–1.0)
pH, UA: 7.5 (ref 5.0–7.5)

## 2021-10-20 LAB — MICROSCOPIC EXAMINATION: Bacteria, UA: NONE SEEN

## 2021-10-23 LAB — CULTURE, URINE COMPREHENSIVE

## 2021-10-25 ENCOUNTER — Encounter
Admission: RE | Admit: 2021-10-25 | Discharge: 2021-10-25 | Disposition: A | Discharge status: Home / Self Care | Payer: Medicare HMO | Source: Ambulatory Visit | Attending: Urology | Admitting: Urology

## 2021-10-25 ENCOUNTER — Other Ambulatory Visit: Payer: Self-pay

## 2021-10-25 ENCOUNTER — Telehealth: Payer: Self-pay | Admitting: *Deleted

## 2021-10-25 ENCOUNTER — Encounter: Payer: Self-pay | Admitting: Urology

## 2021-10-25 DIAGNOSIS — Z01812 Encounter for preprocedural laboratory examination: Secondary | ICD-10-CM | POA: Insufficient documentation

## 2021-10-25 HISTORY — DX: Unspecified osteoarthritis, unspecified site: M19.90

## 2021-10-25 LAB — COMPREHENSIVE METABOLIC PANEL
ALT: 23 U/L (ref 0–44)
AST: 27 U/L (ref 15–41)
Albumin: 4.1 g/dL (ref 3.5–5.0)
Alkaline Phosphatase: 61 U/L (ref 38–126)
Anion gap: 10 (ref 5–15)
BUN: 27 mg/dL — ABNORMAL HIGH (ref 8–23)
CO2: 23 mmol/L (ref 22–32)
Calcium: 9.2 mg/dL (ref 8.9–10.3)
Chloride: 105 mmol/L (ref 98–111)
Creatinine, Ser: 0.93 mg/dL (ref 0.61–1.24)
GFR, Estimated: >60 mL/min (ref >60)
Glucose, Bld: 89 mg/dL (ref 70–99)
Potassium: 4.3 mmol/L (ref 3.5–5.1)
Sodium: 138 mmol/L (ref 135–145)
Total Bilirubin: 1.3 mg/dL — ABNORMAL HIGH (ref 0.3–1.2)
Total Protein: 7.4 g/dL (ref 6.5–8.1)

## 2021-10-25 LAB — CBC
HCT: 42.8 % (ref 39.0–52.0)
Hemoglobin: 14.4 g/dL (ref 13.0–17.0)
MCH: 31 pg (ref 26.0–34.0)
MCHC: 33.6 g/dL (ref 30.0–36.0)
MCV: 92.2 fL (ref 80.0–100.0)
Platelets: 213 10*3/uL (ref 150–400)
RBC: 4.64 MIL/uL (ref 4.22–5.81)
RDW: 11.5 % (ref 11.5–15.5)
WBC: 7.5 10*3/uL (ref 4.0–10.5)
nRBC: 0 % (ref 0.0–0.2)

## 2021-10-25 NOTE — Patient Instructions (Addendum)
Your procedure is scheduled on: Report to the Registration Desk on the 1st floor of the [facility]. To find out your arrival time, please call [phone] between 1PM - 3PM on: If your arrival time is 6:00 am, do not arrive prior to that time as the [facility] entrance doors do not open until 6:00 am.  REMEMBER: Instructions that are not followed completely may result in serious medical risk, up to and including death; or upon the discretion of your surgeon and anesthesiologist your surgery may need to be rescheduled.  Do not eat food or drink any fluids after midnight the night before surgery.  No gum chewing, lozengers or hard candies.  TAKE ONLY THESE MEDICATIONS THE MORNING OF SURGERY WITH A SIP OF WATER:  - metoprolol succinate (TOPROL-XL) 25 MG 24 hr tablet   Stop taking beginning 10/27/21 , One week prior to surgery: Stop Anti-inflammatories (NSAIDS) such as Advil, Aleve, Ibuprofen, Motrin, Naproxen, Naprosyn and Aspirin based products such as Excedrin, Goodys Powder, BC Powder.  Stop ANY OVER THE COUNTER supplements until after surgery.  You may take Tylenol if needed for pain up until the day of surgery.  No Alcohol for 24 hours before or after surgery.  No Smoking including e-cigarettes for 24 hours prior to surgery.  No chewable tobacco products for at least 6 hours prior to surgery.  No nicotine patches on the day of surgery.  Do not use any "recreational" drugs for at least a week prior to your surgery.  Please be advised that the combination of cocaine and anesthesia may have negative outcomes, up to and including death. If you test positive for cocaine, your surgery will be cancelled.  On the morning of surgery brush your teeth with toothpaste and water, you may rinse your mouth with mouthwash if you wish. Do not swallow any toothpaste or mouthwash.  Do not wear jewelry, make-up, hairpins, clips or nail polish.  Do not wear lotions, powders, or perfumes.    Do not shave body from the neck down 48 hours prior to surgery just in case you cut yourself which could leave a site for infection.  Also, freshly shaved skin may become irritated if using the CHG soap.  Contact lenses, hearing aids and dentures may not be worn into surgery.  Do not bring valuables to the hospital. [facility] is not responsible for any missing/lost belongings or valuables.   Notify your doctor if there is any change in your medical condition (cold, fever, infection).  Wear comfortable clothing (specific to your surgery type) to the hospital.  After surgery, you can help prevent lung complications by doing breathing exercises.  Take deep breaths and cough every 1-2 hours. Your doctor may order a device called an Incentive Spirometer to help you take deep breaths. When coughing or sneezing, hold a pillow firmly against your incision with both hands. This is called "splinting." Doing this helps protect your incision. It also decreases belly discomfort.  If you are being admitted to the hospital overnight, leave your suitcase in the car. After surgery it may be brought to your room.  If you are being discharged the day of surgery, you will not be allowed to drive home. You will need a responsible adult (18 years or older) to drive you home and stay with you that night.   If you are taking public transportation, you will need to have a responsible adult (18 years or older) with you. Please confirm with your physician that  it is acceptable to use public transportation.   Please call the [facility] at [phone] if you have any questions about these instructions.  Surgery Visitation Policy:  Patients undergoing a surgery or procedure may have two family members or support persons with them as long as the person is not COVID-19 positive or experiencing its symptoms.   Inpatient Visitation:    Visiting hours are 7 a.m. to 8 p.m. Up to four visitors  are allowed at one time in a patient room, including children. The visitors may rotate out with other people during the day. One designated support person (adult) may remain overnight.

## 2021-10-25 NOTE — Telephone Encounter (Signed)
-----   Message from Karen Kitchens, NP sent at 10/25/2021  4:37 PM EDT ----- Regarding: Request for pre-operative cardiac clearance Request for pre-operative cardiac clearance:  1. What type of surgery is being performed?  CYSTOSCOPY WITH INSERTION OF UROLIFT  2. When is this surgery scheduled?  11/01/2021  3. Type of clearance being requested (medical, pharmacy, both). MEDICAL    4. Are there any medications that need to be held prior to surgery? ASA  5. Practice name and name of physician performing surgery?  Performing surgeon: Dr. Hollice Espy, MD Requesting clearance: Honor Loh, FNP-C    6. Anesthesia type (none, local, MAC, general)? MAC  7. What is the office phone and fax number?   Phone: [phone] Fax: [phone]  ATTENTION: Unable to create telephone message as per your standard workflow. Directed by [facility] providers to send requests for cardiac clearance to this pool for appropriate distribution to provider covering pre-operative clearances.   Honor Loh, MSN, APRN, FNP-C, CEN [facility]  Peri-operative Services Nurse Practitioner Phone: [phone] 10/25/21 4:37 PM

## 2021-10-26 ENCOUNTER — Telehealth: Payer: Self-pay

## 2021-10-26 ENCOUNTER — Telehealth: Payer: Self-pay | Admitting: *Deleted

## 2021-10-26 ENCOUNTER — Encounter: Payer: Self-pay | Admitting: Urology

## 2021-10-26 NOTE — Progress Notes (Signed)
Perioperative Services  Pre-Admission/Anesthesia Testing Clinical Review  Date: 10/28/21  Patient Demographics:  Name: Dennis Bond DOB:   23-Feb-1931 MRN:   [id number]  Planned Surgical Procedure(s):    Case: [id number] Date/Time: 11/01/21 1222   Procedure: CYSTOSCOPY WITH INSERTION OF UROLIFT   Anesthesia type: Monitor Anesthesia Care   Pre-op diagnosis: Benign Prostatic Hyperplasia with Bladder Outlet Obstruction   Location: [facility] OR ROOM 10 / [facility]   Surgeons: Dennis Scotland, MD   NOTE: Available PAT nursing documentation and vital signs have been reviewed. Clinical nursing staff has updated patient's PMH/PSHx, current medication list, and drug allergies/intolerances to ensure comprehensive history available to assist in medical decision making as it pertains to the aforementioned surgical procedure and anticipated anesthetic course. Extensive review of available clinical information performed. Dennis Bond PMH and PSHx updated with any diagnoses/procedures that  may have been inadvertently omitted during his intake with the pre-admission testing department's nursing staff.  Clinical Discussion:  Dennis Bond is a 88 y.o. male who is submitted for pre-surgical anesthesia review and clearance prior to him undergoing the above procedure.Patient is a Former Dennis Bond. Pertinent PMH includes: CAD, diastolic dysfunction, PSVT, HTN, HLD, BPH, OA, lumbar DDD, lumbar spondylosis, balance issues, insomnia.  Patient is followed by cardiology Dennis Milling, MD). He was last seen in the cardiology clinic on 12/01/2020; notes reviewed.  At the time of his clinic visit, patient doing well overall from a cardiovascular spectrum.  He denied any episodes of chest pain, short of breath, PND, orthopnea, palpitations, significant peripheral edema, vertiginous symptoms, or presyncope/syncope.  Patient with a past medical history significant for cardiovascular diagnoses.  Exercise treadmill test  performed on 09/03/2008 revealed ST elevation in the inferior and anterolateral leads.  Maximum heart rate achieved was 126 bpm.  Patient able to achieve a maximum workload of 10.1 METS.  Diagnostic left heart catheterization was performed on 09/05/2008 revealing a hyperdynamic left ventricular systolic function with an EF of 78%.  There was multivessel CAD; 95% proximal LAD, 95% mid LAD, and 90% RV marginal.  PCI of the proximal/mid LAD was performed placing 2 overlapping drug-eluting stents (type unknown).  Repeat exercise treadmill test performed on 01/26/2009 was found to be negative.  Patient achieved a heart rate of 133 bpm and a maximum workload of 13.1 METS.  TTE performed on 11/20/2009 revealed a normal left ventricular systolic function with an EF of >55%.  There was mild LVH and LAE.  Trivial atrial, mitral, and tricuspid valve regurgitation noted. Diastolic Doppler parameters consistent with abnormal relaxation (G1DD).  There was no evidence of a significant transvalvular gradient to suggest stenosis.  Blood pressure well controlled at 122/80 on currently prescribed lisinopril and metoprolol succinate therapies.  He is on atorvastatin for his HLD diagnosis and further ASCVD prevention.  He is not diabetic.  Probably has some balance issues and chronic back pain, patient reported to be very active at baseline.  He participates in yoga 3 times a week, muscle building exercises, and gardening.  That being said, per the DASI, patient felt to be able to achieve >/= 4 METS of activity without experiencing any type of angina/anginal equivalent symptoms.  No changes were made to his medication regimen.  Patient scheduled to follow-up with outpatient cardiology in 1 year or sooner if needed.  Dennis Bond is scheduled for an elective CYSTOSCOPY WITH INSERTION OF UROLIFT on 11/01/2021 with Dr. Vanna Scotland, MD.  Given patient's past medical history significant for cardiovascular diagnoses,  presurgical  cardiac clearance was sought by the PAT team. Per cardiology, "Mr. Dennis Bond's perioperative risk of a major cardiac event is 0.9% according to the Revised Cardiac Risk Index (RCRI). Therefore, he is at low risk for perioperative complications.   His functional capacity is good at 5.19 METs according to the Duke Activity Status Index (DASI). According to ACC/AHA guidelines, no further cardiovascular testing needed.  The patient may proceed to surgery at an ACCEPTABLE risk". In review of his medication reconciliation, it is noted the patient is on daily antiplatelet therapy.  He has been instructed on recommendations from his surgeon for holding his daily ASA dose for 5 days prior to his procedure with plans to restart as soon as postoperative bleeding respectively minimized by his primary attending surgeon.  Patient is aware that his last dose of ASA should be on 10/26/2021.  Patient denies previous perioperative complications with anesthesia in the past. In review of the available records, it is noted that patient underwent a general anesthetic course at [facility] ([facility]) in 01/2014 without documented complications.      08/24/2021    1:45 PM 08/03/2021    1:41 PM 06/27/2021   11:00 AM  Vitals with BMI  Height 5\' 9"  5\' 9"    Weight 170 lbs 170 lbs   BMI 25.09 25.09   Systolic 94 112 151  Diastolic 75 74 94  Pulse 110 73 65    Providers/Specialists:   NOTE: Primary physician provider listed below. Patient may have been seen by APP or partner within same practice.   PROVIDER ROLE / SPECIALTY LAST , MD Urology (Surgeon) 08/24/2021  Jaquelyn Bitter, MD Primary Care Provider 08/17/2021  Clydie Braun, MD Cardiology 12/01/2020   Allergies:  Levonorgestrel-ethinyl estrad  Current Home Medications:   No current facility-administered medications for this encounter.    aspirin 81 MG EC tablet   atorvastatin (LIPITOR) 20 MG tablet   ibuprofen (ADVIL) 800  MG tablet   lisinopril (ZESTRIL) 20 MG tablet   metoprolol succinate (TOPROL-XL) 25 MG 24 hr tablet   Multiple Vitamins-Minerals (PRESERVISION AREDS 2 PO)   History:   Past Medical History:  Diagnosis Date   Allergic rhinitis due to pollen    Arthritis    BPH (benign prostatic hyperplasia)    Chronic back pain    Coronary artery disease 09/05/2008   a.) LHC/PCI 09/05/2008: EF 78%, 95% pLAD, 95% mLAD, 90% RV marginal --> 2 overlapping stents (unknown type) placed to the p-mLAD   DDD (degenerative disc disease), lumbar    Diastolic dysfunction 11/20/2009   a.) TTE 11/20/2009: EF >55%, mild LVH, mild LAE, triv AR/MR/TR, G1DD   Hyperlipidemia    Hypertension    Insomnia    Lumbar spondylolysis    Medial epicondylitis    Osteoarthritis    Poor balance    PSVT (paroxysmal supraventricular tachycardia) (HCC)    Urge incontinence    Past Surgical History:  Procedure Laterality Date   BACK SURGERY     CAROTID STENT     09/05/2008   No family history on file. Social History   Tobacco Use   Smoking status: Former   Smokeless tobacco: Never  11/22/2009 Use: Never used    Pertinent Clinical Results:  LABS: Labs reviewed: Acceptable for surgery.  No visits with results within 3 Day(s) from this visit.  Latest known visit with results is:  Hospital Outpatient Visit on 10/25/2021  Component Date Value  Ref Range Status   Sodium 10/25/2021 138  135 - 145 mmol/L Final   Potassium 10/25/2021 4.3  3.5 - 5.1 mmol/L Final   Chloride 10/25/2021 105  98 - 111 mmol/L Final   CO2 10/25/2021 23  22 - 32 mmol/L Final   Glucose, Bld 10/25/2021 89  70 - 99 mg/dL Final   Glucose reference range applies only to samples taken after fasting for at least 8 hours.   BUN 10/25/2021 27 (H)  8 - 23 mg/dL Final   Creatinine, Ser 10/25/2021 0.93  0.61 - 1.24 mg/dL Final   Calcium 09/25/1599 9.2  8.9 - 10.3 mg/dL Final   Total Protein 09/32/3557 7.4  6.5 - 8.1 g/dL Final   Albumin  32/20/2542 4.1  3.5 - 5.0 g/dL Final   AST 70/62/3762 27  15 - 41 U/L Final   ALT 10/25/2021 23  0 - 44 U/L Final   Alkaline Phosphatase 10/25/2021 61  38 - 126 U/L Final   Total Bilirubin 10/25/2021 1.3 (H)  0.3 - 1.2 mg/dL Final   GFR, Estimated 10/25/2021 >60  >60 mL/min Final   Comment: (NOTE) Calculated using the CKD-EPI Creatinine Equation (2021)    Anion gap 10/25/2021 10  5 - 15 Final   Performed at [facility], [address] [place], [place] [address]   WBC 10/25/2021 7.5  4.0 - 10.5 K/uL Final   RBC 10/25/2021 4.64  4.22 - 5.81 MIL/uL Final   Hemoglobin 10/25/2021 14.4  13.0 - 17.0 g/dL Final   HCT 76/16/0737 42.8  39.0 - 52.0 % Final   MCV 10/25/2021 92.2  80.0 - 100.0 fL Final   MCH 10/25/2021 31.0  26.0 - 34.0 pg Final   MCHC 10/25/2021 33.6  30.0 - 36.0 g/dL Final   RDW 10/62/6948 11.5  11.5 - 15.5 % Final   Platelets 10/25/2021 213  150 - 400 K/uL Final   nRBC 10/25/2021 0.0  0.0 - 0.2 % Final   Performed at [facility], [address] [place], [place] [address]    ECG: Date: 06/27/2021 Time ECG obtained: 0946 AM Rate: 71 bpm Rhythm:  Sinus rhythm with PACs Axis (leads I and aVF): Normal Intervals: PR 191 ms. QRS 101 ms. QTc 419 ms. ST segment and T wave changes: No evidence of acute ST segment elevation or depression Comparison: Similar to previous tracing obtained on 12/01/2020   IMAGING / PROCEDURES: MR LUMBAR SPINE WO CONTRAST performed on 02/07/2020 L5 has some transitional features. L3-4: Previous posterior decompression. Disc degeneration with loss of height. Prominent anterior disc herniation which could be painful. Discogenic endplate edematous changes, particularly on the right, likely associated with back pain. Endplate osteophytes and bulging of the disc. Bilateral lateral recess and foraminal stenosis with some potential for neural compression. Lesser, grossly non-compressive degenerative changes at L1-2, L2-3 and  L4-5.  TRANSTHORACIC ECHOCARDIOGRAM performed on 11/20/2009 Normal left ventricular systolic function with mild LVH Diastolic Doppler parameters consistent with abnormal relaxation (G1DD). Normal right ventricular systolic function Trivial AR, MR, PR No TR Normal gradients; no valvular stenosis No pericardial effusion  Impression and Plan:  Dennis Bond has been referred for pre-anesthesia review and clearance prior to him undergoing the planned anesthetic and procedural courses. Available labs, pertinent testing, and imaging results were personally reviewed by me. This patient has been appropriately cleared by cardiology with an overall ACCEPTABLE risk of significant perioperative cardiovascular complications.  Based on clinical review performed today (10/28/21), barring any significant acute  changes in the patient's overall condition, it is anticipated that he will be able to proceed with the planned surgical intervention. Any acute changes in clinical condition may necessitate his procedure being postponed and/or cancelled. Patient will meet with anesthesia team (MD and/or CRNA) on the day of his procedure for preoperative evaluation/assessment. Questions regarding anesthetic course will be fielded at that time.   Pre-surgical instructions were reviewed with the patient during his PAT appointment and questions were fielded by PAT clinical staff. Patient was advised that if any questions or concerns arise prior to his procedure then he should return a call to PAT and/or his surgeon's office to discuss.  Quentin Mulling, MSN, APRN, FNP-C, CEN [facility]  Peri-operative Services Nurse Practitioner Phone: [phone] Fax: [phone] 10/28/21 5:02 PM  NOTE: This note has been prepared using Dragon dictation software. Despite my best ability to proofread, there is always the potential that unintentional transcriptional errors may still occur from this process.

## 2021-10-26 NOTE — Telephone Encounter (Signed)
I s/w the pt and he is agreeable to plan of care for tele pre op appt Thursday 10/28/21 @ 10:40. Pt was added on as his procedure is set for 11/01/21 Monday. Med rec and consent are done.    Pt had questions about what time is his procedure going to start as he states his son wants to be there,but he lives in [facility] and takes a little while to get here. I informed the pt that he needs to s/w Dr. Apolinar Junes office in regard to procedure start time. Pt seemed confused as to who to call. I assured the pt that I will send a message to the requesting office to reach out to the pt in regard to procedure time.      Patient Consent for Virtual Visit        Dennis Bond has provided verbal consent on 10/26/2021 for a virtual visit (video or telephone).   CONSENT FOR VIRTUAL VISIT FOR:  Dennis Bond  By participating in this virtual visit I agree to the following:  I hereby voluntarily request, consent and authorize [facility] and its employed or contracted physicians, Producer, television/film/video, nurse practitioners or other licensed health care professionals (the Practitioner), to provide me with telemedicine health care services (the "Services") as deemed necessary by the treating Practitioner. I acknowledge and consent to receive the Services by the Practitioner via telemedicine. I understand that the telemedicine visit will involve communicating with the Practitioner through live audiovisual communication technology and the disclosure of certain medical information by electronic transmission. I acknowledge that I have been given the opportunity to request an in-person assessment or other available alternative prior to the telemedicine visit and am voluntarily participating in the telemedicine visit.  I understand that I have the right to withhold or withdraw my consent to the use of telemedicine in the course of my care at any time, without affecting my right to future care or treatment, and that the  Practitioner or I may terminate the telemedicine visit at any time. I understand that I have the right to inspect all information obtained and/or recorded in the course of the telemedicine visit and may receive copies of available information for a reasonable fee.  I understand that some of the potential risks of receiving the Services via telemedicine include:  Delay or interruption in medical evaluation due to technological equipment failure or disruption; Information transmitted may not be sufficient (e.g. poor resolution of images) to allow for appropriate medical decision making by the Practitioner; and/or  In rare instances, security protocols could fail, causing a breach of personal health information.  Furthermore, I acknowledge that it is my responsibility to provide information about my medical history, conditions and care that is complete and accurate to the best of my ability. I acknowledge that Practitioner's advice, recommendations, and/or decision may be based on factors not within their control, such as incomplete or inaccurate data provided by me or distortions of diagnostic images or specimens that may result from electronic transmissions. I understand that the practice of medicine is not an exact science and that Practitioner makes no warranties or guarantees regarding treatment outcomes. I acknowledge that a copy of this consent can be made available to me via my patient portal [facility] MyChart), or I can request a printed copy by calling the office of [facility].    I understand that my insurance will be billed for this visit.   I have read or had this consent  read to me. I understand the contents of this consent, which adequately explains the benefits and risks of the Services being provided via telemedicine.  I have been provided ample opportunity to ask questions regarding this consent and the Services and have had my questions answered to my satisfaction. I give my  informed consent for the services to be provided through the use of telemedicine in my medical care

## 2021-10-26 NOTE — Telephone Encounter (Signed)
Spoke with Mr. Dennis Bond advised him that he may take AZO to help with the numbing of the urine per Dr. Apolinar Junes. Patient is worried that he will have a great deal of pain after Urolift, reassured patient that Dr. Apolinar Junes will give him medication to help the pain subside. We discussed in great detail

## 2021-10-26 NOTE — Telephone Encounter (Signed)
Her aspirin is prescribed by a noncardiac provider.  She will need to have recommendations for holding aspirin by prescribing provider.  Preoperative team, please contact this patient and set up a phone call appointment for further cardiac evaluation.  Thank you for your help.  Thomasene Ripple. Amika Tassin NP-C    10/26/2021, 10:26 AM [facility] [address] Office [phone] Fax [phone]

## 2021-10-26 NOTE — Telephone Encounter (Signed)
I s/w the pt and he is agreeable to plan of care for tele pre op appt Thursday 10/28/21 @ 10:40. Pt was added on as his procedure is set for 11/01/21 Monday. Med rec and consent are done.    Pt had questions about what time is his procedure going to start as he states his son wants to be there,but he lives in [facility] and takes a little while to get here. I informed the pt that he needs to s/w Dr. Apolinar Junes office in regard to procedure start time. Pt seemed confused as to who to call. I assured the pt that I will send a message to the requesting office to reach out to the pt in regard to procedure time.

## 2021-10-28 ENCOUNTER — Ambulatory Visit (INDEPENDENT_AMBULATORY_CARE_PROVIDER_SITE_OTHER): Payer: Medicare HMO | Admitting: Physician Assistant

## 2021-10-28 DIAGNOSIS — Z01810 Encounter for preprocedural cardiovascular examination: Secondary | ICD-10-CM

## 2021-10-28 NOTE — Progress Notes (Signed)
Virtual Visit via Telephone Note   Because of Dennis Bond's co-morbid illnesses, he is at least at moderate risk for complications without adequate follow up.  This format is felt to be most appropriate for this patient at this time.  The patient did not have access to video technology/had technical difficulties with video requiring transitioning to audio format only (telephone).  All issues noted in this document were discussed and addressed.  No physical exam could be performed with this format.  Please refer to the patient's chart for his consent to telehealth for [facility].  Evaluation Performed:  Preoperative cardiovascular risk assessment _____________   Date:  10/28/2021   Patient ID:  Dennis Bond, DOB 1930-06-04, MRN [id number] Patient Location:  Home Provider location:   Office  Primary Care Provider:  [facility] Primary Cardiologist:  None  Chief Complaint / Patient Profile   88 y.o. y/o male with a h/o CAD (cardiac cath 08/2008 at [facility] showed EF 78%, 95% proximal LAD, 95% mid LAD stenosis, RV marginal 90% stenosis, status post successful PCI to proximal/mid LAD with 2 overlapping DES), hyperlipidemia, hypertension, chronic back pain who is pending cystoscopy with insertion of UroLift and presents today for telephonic preoperative cardiovascular risk assessment.  Past Medical History    Past Medical History:  Diagnosis Date   Allergic rhinitis due to pollen    Arthritis    BPH (benign prostatic hyperplasia)    Chronic back pain    Coronary artery disease 09/05/2008   a.) LHC/PCI 09/05/2008: EF 78%, 95% pLAD, 95% mLAD, 90% RV marginal --> 2 overlapping stents (unknown type) placed to the p-mLAD   DDD (degenerative disc disease), lumbar    Diastolic dysfunction 11/20/2009   a.) TTE 11/20/2009: EF >55%, mild LVH, mild LAE, triv AR/MR/TR, G1DD   Hyperlipidemia    Hypertension    Insomnia    Lumbar spondylolysis    Medial epicondylitis    Osteoarthritis     Poor balance    PSVT (paroxysmal supraventricular tachycardia) (HCC)    Urge incontinence    Past Surgical History:  Procedure Laterality Date   BACK SURGERY     CAROTID STENT     09/05/2008    Allergies  Allergies  Allergen Reactions   Levonorgestrel-Ethinyl Estrad Other (See Comments)    Spring allergy.    History of Present Illness    Dennis Bond is a 88 y.o. male who presents via Web designer for a telehealth visit today.  Pt was last seen in cardiology clinic on 12/01/2020 by Dr. Mariah Milling.  At that time [facility] was doing well .  The patient is now pending procedure as outlined above. Since his last visit, he has been doing really well.  Last blood pressure reading was 120/90 which is excellent.  He walks a mile on the weekend and really only has back problems that are pretty bad.  No problems with his heart.  No chest pain or shortness of breath.  He states that they even killed the nerves in his back and he still has pain.  He mentioned having a recent issue with some prostate medicine he was taking.  He was getting dizzy especially in the morning.  He stopped taking this medication.  He says his blood pressure has been fine.  He drinks coffee and beer but not much water.  We discussed hydrating appropriately and this may help his dizziness.  He is very active and cooks every day.  He also has flower beds that he maintains outside his home at [facility] [address].  We discussed ASA.  He is already holding this medication.  I think from a cardiac standpoint it would be fine to hold this medicine 7 days prior to his procedure.  Please resume when medically safe to do so.  Of note, this medicine comes from his PCP.  Surgeon may have already discussed this with PCP.  Reports no shortness of breath nor dyspnea on exertion. Reports no chest pain, pressure, or tightness. No edema, orthopnea, PND. Reports no palpitations.     Home Medications    Prior to Admission medications    Medication Sig Start Date End Date Taking? Authorizing Provider  aspirin 81 MG EC tablet Take by mouth.    [provider]  atorvastatin (LIPITOR) 20 MG tablet Take 1 tablet (20 mg total) by mouth daily. 12/01/20   Antonieta Iba, MD  ibuprofen (ADVIL) 800 MG tablet Take by mouth. 06/25/19   [provider]  lisinopril (ZESTRIL) 20 MG tablet Take 1 tablet (20 mg total) by mouth daily. 12/01/20   Antonieta Iba, MD  metoprolol succinate (TOPROL-XL) 25 MG 24 hr tablet Take 1 tablet (25 mg total) by mouth daily. 12/01/20   Antonieta Iba, MD  Multiple Vitamins-Minerals (PRESERVISION AREDS 2 PO) Take by mouth daily.    [provider]    Physical Exam    Vital Signs:  Dennis Bond does not have vital signs available for review today. 120/90  Given telephonic nature of communication, physical exam is limited. AAOx3. NAD. Normal affect.  Speech and respirations are unlabored.  Accessory Clinical Findings    None  Assessment & Plan    1.  Preoperative Cardiovascular Risk Assessment: { Dennis Bond perioperative risk of a major cardiac event is 0.9% according to the Revised Cardiac Risk Index (RCRI).  Therefore, he is at low risk for perioperative complications.   His functional capacity is good at 5.19 METs according to the Duke Activity Status Index (DASI). Recommendations: According to ACC/AHA guidelines, no further cardiovascular testing needed.  The patient may proceed to surgery at acceptable risk.   Antiplatelet and/or Anticoagulation Recommendations: Aspirin can be held for 7 days prior to his surgery.  Please resume Aspirin post operatively when it is felt to be safe from a bleeding standpoint.   A copy of this note will be routed to requesting surgeon.  Time:   Today, I have spent 15 minutes with the patient with telehealth technology discussing medical history, symptoms, and management plan.     Sharlene Dory, PA-C  10/28/2021, 10:32 AM

## 2021-10-29 ENCOUNTER — Encounter: Payer: Self-pay | Admitting: Emergency Medicine

## 2021-10-29 ENCOUNTER — Emergency Department
Admission: EM | Admit: 2021-10-29 | Discharge: 2021-10-29 | Disposition: A | Discharge status: Home / Self Care | Payer: Medicare HMO | Attending: Emergency Medicine | Admitting: Emergency Medicine

## 2021-10-29 ENCOUNTER — Other Ambulatory Visit: Payer: Self-pay

## 2021-10-29 DIAGNOSIS — T85618A Breakdown (mechanical) of other specified internal prosthetic devices, implants and grafts, initial encounter: Secondary | ICD-10-CM

## 2021-10-29 DIAGNOSIS — K08531 Fractured dental restorative material with loss of material: Secondary | ICD-10-CM | POA: Diagnosis not present

## 2021-10-29 DIAGNOSIS — R6884 Jaw pain: Secondary | ICD-10-CM | POA: Diagnosis present

## 2021-10-29 NOTE — ED Triage Notes (Signed)
Patient has upper right false teeth being held in by one screw and patient is requesting it to be removed. States all dentist closed at this time.

## 2021-10-29 NOTE — Discharge Instructions (Addendum)
Temporary repair of your failed dental implants using Dermabond and metal brace. Follow-up with your Dental Provider.

## 2021-10-29 NOTE — ED Provider Notes (Signed)
[facility] Department Emergency Department Provider Note     Event Date/Time   First MD Initiated Contact with Patient 10/29/21 1754     (approximate)   History   Dental Injury   HPI  Dennis Bond is a 88 y.o. male presents to the ED for evaluation of a defective screw in his temporary dental implant to the right upper jaw.  Patient presents with some free movement of his first and second molar implants.  He is unable to make contact with a local dental provider, but plans to report to [facility] dental clinic on Monday.  He is asking for temporary fix to help secure his dental implant.     Physical Exam   Triage Vital Signs: ED Triage Vitals  Enc Vitals Group     BP 10/29/21 1711 (!) 137/94     Pulse Rate 10/29/21 1711 86     Resp 10/29/21 1711 16     Temp 10/29/21 1711 98.3 F (36.8 C)     Temp Source 10/29/21 1711 Oral     SpO2 10/29/21 1711 96 %     Weight 10/29/21 1712 170 lb (77.1 kg)     Height 10/29/21 1712 5\' 9"  (1.753 m)     Head Circumference --      Peak Flow --      Pain Score 10/29/21 1712 0     Pain Loc --      Pain Edu? --      Excl. in GC? --     Most recent vital signs: Vitals:   10/29/21 1711  BP: (!) 137/94  Pulse: 86  Resp: 16  Temp: 98.3 F (36.8 C)  SpO2: 96%    General Awake, no distress.  HEENT NCAT. PERRL. EOMI. No rhinorrhea. Mucous membranes are moist.  Patient with a partially attached temporary dental implant involving his first and second molars of the right upper mandible.  No focal gum swelling is appreciated. CV:  Good peripheral perfusion.  RESP:  Normal effort.  ABD:  No distention.    ED Results / Procedures / Treatments   Labs (all labs ordered are listed, but only abnormal results are displayed) Labs Reviewed - No data to display   EKG   RADIOLOGY   No results found.   PROCEDURES:  Critical Care performed: No  Procedures  Use Dermabond glue and mental nose please from a oxygen  mask to fashion and secure a brace for the upper molars.  Patient tolerated procedure without difficulty.  MEDICATIONS ORDERED IN ED: Medications - No data to display   IMPRESSION / MDM / ASSESSMENT AND PLAN / ED COURSE  I reviewed the triage vital signs and the nursing notes.                              Differential diagnosis includes, but is not limited to, dental fracture, dental implant failure  Patient's presentation is most consistent with acute complicated illness / injury requiring diagnostic workup.  Patient's diagnosis is consistent with failure of his very dental implant.  Patient is to follow up with his dental provider as needed or otherwise directed. Patient is given ED precautions to return to the ED for any worsening or new symptoms.     FINAL CLINICAL IMPRESSION(S) / ED DIAGNOSES   Final diagnoses:  Broken clasp of denture     Rx / DC Orders   ED  Discharge Orders     None        Note:  This document was prepared using Dragon voice recognition software and may include unintentional dictation errors.    Lissa Hoard, PA-C 10/30/21 0009    Sharman Cheek, MD 11/08/21 1336

## 2021-10-31 MED ORDER — CHLORHEXIDINE GLUCONATE 0.12 % MT SOLN: 15.0000 mL | Freq: Once | OROMUCOSAL | Status: AC

## 2021-10-31 MED ORDER — FAMOTIDINE 20 MG PO TABS: 20.0000 mg | ORAL_TABLET | Freq: Once | ORAL | Status: AC

## 2021-10-31 MED ORDER — ORAL CARE MOUTH RINSE: 15.0000 mL | Freq: Once | OROMUCOSAL | Status: AC

## 2021-10-31 MED ORDER — LACTATED RINGERS IV SOLN: INTRAVENOUS | Status: DC

## 2021-10-31 MED ORDER — CEFAZOLIN SODIUM-DEXTROSE 2-4 GM/100ML-% IV SOLN
2.0000 g | INTRAVENOUS | Status: AC
  Administered 2021-11-01: 2 g via INTRAVENOUS

## 2021-11-01 ENCOUNTER — Encounter: Payer: Self-pay | Admitting: Urology

## 2021-11-01 ENCOUNTER — Ambulatory Visit: Payer: Medicare HMO | Admitting: Urgent Care

## 2021-11-01 ENCOUNTER — Other Ambulatory Visit: Payer: Self-pay

## 2021-11-01 ENCOUNTER — Encounter
Admission: RE | Disposition: A | Discharge status: Home / Self Care | Payer: Self-pay | Source: Home / Self Care | Attending: Urology

## 2021-11-01 ENCOUNTER — Ambulatory Visit
Admission: RE | Admit: 2021-11-01 | Discharge: 2021-11-01 | Disposition: A | Discharge status: Home / Self Care | Payer: Medicare HMO | Attending: Urology | Admitting: Urology

## 2021-11-01 DIAGNOSIS — N138 Other obstructive and reflux uropathy: Secondary | ICD-10-CM | POA: Insufficient documentation

## 2021-11-01 DIAGNOSIS — N401 Enlarged prostate with lower urinary tract symptoms: Secondary | ICD-10-CM | POA: Insufficient documentation

## 2021-11-01 DIAGNOSIS — I2510 Atherosclerotic heart disease of native coronary artery without angina pectoris: Secondary | ICD-10-CM | POA: Diagnosis not present

## 2021-11-01 DIAGNOSIS — Z955 Presence of coronary angioplasty implant and graft: Secondary | ICD-10-CM | POA: Diagnosis not present

## 2021-11-01 DIAGNOSIS — N3289 Other specified disorders of bladder: Secondary | ICD-10-CM | POA: Insufficient documentation

## 2021-11-01 DIAGNOSIS — I10 Essential (primary) hypertension: Secondary | ICD-10-CM | POA: Diagnosis not present

## 2021-11-01 HISTORY — DX: Medial epicondylitis, unspecified elbow: M77.00

## 2021-11-01 HISTORY — PX: CYSTOSCOPY WITH INSERTION OF UROLIFT: SHX6678

## 2021-11-01 HISTORY — DX: Benign prostatic hyperplasia without lower urinary tract symptoms: N40.0

## 2021-11-01 HISTORY — DX: Urge incontinence: N39.41

## 2021-11-01 HISTORY — DX: Unspecified osteoarthritis, unspecified site: M19.90

## 2021-11-01 HISTORY — DX: Supraventricular tachycardia: I47.1

## 2021-11-01 HISTORY — DX: Other intervertebral disc degeneration, lumbar region without mention of lumbar back pain or lower extremity pain: M51.369

## 2021-11-01 HISTORY — DX: Supraventricular tachycardia, unspecified: I47.10

## 2021-11-01 HISTORY — DX: Spondylolysis, lumbar region: M43.06

## 2021-11-01 HISTORY — DX: Insomnia, unspecified: G47.00

## 2021-11-01 HISTORY — DX: Allergic rhinitis due to pollen: J30.1

## 2021-11-01 HISTORY — DX: Other intervertebral disc degeneration, lumbar region: M51.36

## 2021-11-01 HISTORY — DX: Other abnormalities of gait and mobility: R26.89

## 2021-11-01 HISTORY — DX: Other chronic pain: G89.29

## 2021-11-01 SURGERY — CYSTOSCOPY WITH INSERTION OF UROLIFT
Anesthesia: General | Site: Prostate

## 2021-11-01 MED ORDER — PROPOFOL 10 MG/ML IV BOLUS
INTRAVENOUS | Status: AC
  Filled 2021-11-01: qty 20

## 2021-11-01 MED ORDER — ONDANSETRON HCL 4 MG/2ML IJ SOLN: 4.0000 mg | Freq: Once | INTRAMUSCULAR | Status: DC | PRN

## 2021-11-01 MED ORDER — FENTANYL CITRATE (PF) 100 MCG/2ML IJ SOLN: 25.0000 ug | INTRAMUSCULAR | Status: DC | PRN

## 2021-11-01 MED ORDER — LIDOCAINE HCL URETHRAL/MUCOSAL 2 % EX GEL
CUTANEOUS | Status: DC | PRN
  Administered 2021-11-01: 1 via URETHRAL

## 2021-11-01 MED ORDER — OXYCODONE HCL 5 MG PO TABS: 5.0000 mg | ORAL_TABLET | Freq: Once | ORAL | Status: AC | PRN

## 2021-11-01 MED ORDER — OXYCODONE HCL 5 MG/5ML PO SOLN: 5.0000 mg | Freq: Once | ORAL | Status: AC | PRN

## 2021-11-01 MED ORDER — CHLORHEXIDINE GLUCONATE 0.12 % MT SOLN
OROMUCOSAL | Status: AC
  Administered 2021-11-01: 15 mL via OROMUCOSAL
  Filled 2021-11-01: qty 15

## 2021-11-01 MED ORDER — ACETAMINOPHEN 10 MG/ML IV SOLN: 1000.0000 mg | Freq: Once | INTRAVENOUS | Status: DC | PRN

## 2021-11-01 MED ORDER — CEFAZOLIN SODIUM-DEXTROSE 2-4 GM/100ML-% IV SOLN
INTRAVENOUS | Status: AC
  Filled 2021-11-01: qty 100

## 2021-11-01 MED ORDER — OXYCODONE HCL 5 MG PO TABS
ORAL_TABLET | ORAL | Status: AC
  Administered 2021-11-01: 5 mg via ORAL
  Filled 2021-11-01: qty 1

## 2021-11-01 MED ORDER — PROPOFOL 500 MG/50ML IV EMUL
INTRAVENOUS | Status: DC | PRN
  Administered 2021-11-01: 50 ug/kg/min via INTRAVENOUS

## 2021-11-01 MED ORDER — LIDOCAINE HCL (CARDIAC) PF 100 MG/5ML IV SOSY
PREFILLED_SYRINGE | INTRAVENOUS | Status: DC | PRN
  Administered 2021-11-01: 50 mg via INTRAVENOUS

## 2021-11-01 MED ORDER — FAMOTIDINE 20 MG PO TABS
ORAL_TABLET | ORAL | Status: AC
  Administered 2021-11-01: 20 mg via ORAL
  Filled 2021-11-01: qty 1

## 2021-11-01 SURGICAL SUPPLY — 16 items
BAG DRAIN SIEMENS DORNER NS (MISCELLANEOUS) ×2 IMPLANT
BAG DRN NS LF (MISCELLANEOUS) ×1
GAUZE 4X4 16PLY ~~LOC~~+RFID DBL (SPONGE) ×4 IMPLANT
GLOVE BIO SURGEON STRL SZ 6.5 (GLOVE) ×2 IMPLANT
GOWN STRL REUS W/ TWL LRG LVL3 (GOWN DISPOSABLE) ×2 IMPLANT
GOWN STRL REUS W/TWL LRG LVL3 (GOWN DISPOSABLE) ×4
KIT TURNOVER CYSTO (KITS) ×2 IMPLANT
MANIFOLD NEPTUNE II (INSTRUMENTS) ×2 IMPLANT
PACK CYSTO AR (MISCELLANEOUS) ×2 IMPLANT
SET CYSTO W/LG BORE CLAMP LF (SET/KITS/TRAYS/PACK) ×2 IMPLANT
SURGILUBE 2OZ TUBE FLIPTOP (MISCELLANEOUS) IMPLANT
SYSTEM UROLIFT 2 CART W/ HNDL (Male Continence) ×2 IMPLANT
SYSTEM UROLIFT 2 CARTRIDGE (Male Continence) ×5 IMPLANT
WATER STERILE IRR 1000ML POUR (IV SOLUTION) ×2 IMPLANT
WATER STERILE IRR 3000ML UROMA (IV SOLUTION) ×2 IMPLANT
WATER STERILE IRR 500ML POUR (IV SOLUTION) ×2 IMPLANT

## 2021-11-01 NOTE — H&P (Signed)
11/01/21  RRR CTAB   CC:     Chief Complaint  Patient presents with   Cysto   TRUS      HPI: Dennis Bond is a 88 y.o. male with a personal history of BPH with urinary frequency/urgency who presents today for a diagnostic cystoscopy.    His most recent PSA on 08/04/2021 was 5.1    He is interested in UroLift for his BPH with urinary frequency.    In the interim, he tried Flomax and had modest improvement but is still relatively symptomatic.        Vitals:    08/24/21 1345  BP: 94/75  Pulse: (!) 110    NED. A&Ox3.   No respiratory distress   Abd soft, NT, ND Normal phallus with bilateral descended testicles   Cystoscopy Procedure Note   Patient identification was confirmed, informed consent was obtained, and patient was prepped using Betadine solution.  Lidocaine jelly was administered per urethral meatus.       Pre-Procedure: - Inspection reveals a normal caliber ureteral meatus.   Procedure: The flexible cystoscope was introduced without difficulty - No urethral strictures/lesions are present. - Normal prostate bilobar coaptation  - Normal bladder neck - Bilateral ureteral orifices identified - Bladder mucosa  reveals no ulcers, tumors, or lesions - No bladder stones - Moderate to severe trabeculation   Retroflexion shows unremarkable     Post-Procedure: - Patient tolerated the procedure well  Past Medical History:  Diagnosis Date   Allergic rhinitis due to pollen    Arthritis    BPH (benign prostatic hyperplasia)    Chronic back pain    Coronary artery disease 09/05/2008   a.) LHC/PCI 09/05/2008: EF 78%, 95% pLAD, 95% mLAD, 90% RV marginal --> 2 overlapping stents (unknown type) placed to the p-mLAD   DDD (degenerative disc disease), lumbar    Diastolic dysfunction 11/20/2009   a.) TTE 11/20/2009: EF >55%, mild LVH, mild LAE, triv AR/MR/TR, G1DD   Hyperlipidemia    Hypertension    Insomnia    Lumbar spondylolysis    Medial epicondylitis     Osteoarthritis    Poor balance    PSVT (paroxysmal supraventricular tachycardia) (HCC)    Urge incontinence    Current Meds  Medication Sig   acetaminophen (TYLENOL) 500 MG tablet Take 1,000 mg by mouth every 6 (six) hours as needed.   aspirin 81 MG EC tablet Take by mouth.   atorvastatin (LIPITOR) 20 MG tablet Take 1 tablet (20 mg total) by mouth daily.   ibuprofen (ADVIL) 800 MG tablet Take by mouth.   lisinopril (ZESTRIL) 20 MG tablet Take 1 tablet (20 mg total) by mouth daily.   metoprolol succinate (TOPROL-XL) 25 MG 24 hr tablet Take 1 tablet (25 mg total) by mouth daily.   Multiple Vitamins-Minerals (PRESERVISION AREDS 2 PO) Take by mouth daily.   Allergies  Allergen Reactions   Levonorgestrel-Ethinyl Estrad Other (See Comments)    Spring allergy.        Prostate transrectal ultrasound sizing   Informed consent was obtained after discussing risks/benefits of the procedure.  A time out was performed to ensure correct patient identity.   Pre-Procedure: -Transrectal probe was placed without difficulty -Transrectal Ultrasound performed revealing a 37.02 gm prostate measuring 3.8  x 4.15 x 4.49 cm (length) -No significant hypoechoic or median lobe noted    Assessment/ Plan:   BPH with outlet obstruction - Sequela of outlet obstruction with significant bladder trabeculation consistent with obstruction from  BPH -He has moderate improvement on Flomax .  - Continues to be interested in Urolift; we discussed the risk and and the efficacy rate.  -We discussed the risks and complications of UroLift which include but are not limited to infection, bleeding, and inadequate treatment with the Urolift procedure alone, anesthetic complications, among others.     He will let us know if he would like to proceed   Tawni Millers as a scribe for Vanna Scotland, MD.,have documented all relevant documentation on the behalf of Vanna Scotland, MD,as directed by  Vanna Scotland, MD while  in the presence of Vanna Scotland, MD.   I have reviewed the above documentation for accuracy and completeness, and I agree with the above.    Vanna Scotland, MD

## 2021-11-01 NOTE — Transfer of Care (Signed)
Immediate Anesthesia Transfer of Care Note  Patient: Dennis Bond  Procedure(s) Performed: CYSTOSCOPY WITH INSERTION OF UROLIFT (Prostate)  Patient Location: PACU  Anesthesia Type:General  Level of Consciousness: drowsy and patient cooperative  Airway & Oxygen Therapy: Patient Spontanous Breathing and Patient connected to face mask oxygen  Post-op Assessment: Report given to RN and Post -op Vital signs reviewed and stable  Post vital signs: Reviewed and stable  Last Vitals:  Vitals Value Taken Time  BP    Temp    Pulse 58 11/01/21 1206  Resp 16 11/01/21 1206  SpO2 99 % 11/01/21 1206  Vitals shown include unvalidated device data.  Last Pain:  Vitals:   11/01/21 0936  TempSrc: Temporal  PainSc: 0-No pain         Complications: No notable events documented.

## 2021-11-01 NOTE — Op Note (Signed)
Preoperative diagnosis: BPH with obstructive symptomatology   Postoperative diagnosis: BPH with obstructive symptomatology   Principal procedure: Urolift procedure, with the placement of 6 implants.   Surgeon: Hollice Espy   Anesthesia: MAC   Complications: None   Drains: None   Estimated blood loss: < 5 mL   Indications: 88 year-old male with obstructive symptomatology secondary to BPH.  The patient's symptoms have progressed, and he has requested further management.  Management options including TURP with resection/ablation of the prostate as well as Urolift were discussed.  The patient has chosen to have a Urolift procedure.  He has been instructed to the procedure as well as risks and complications which include but are not limited to infection, bleeding, and inadequate treatment with the Urolift procedure alone, anesthetic complications, among others.  He understands these and desires to proceed.   Findings: Initially, some difficulty entering the prostatic fossa due to a relatively fixed prostate with significant angulation.  Used a flexible 16 French cystoscope and then transition to a 17 [place] rigid scope.    Prostatic urethra was obstructed secondary to bilobar hypertrophy.  The bladder was inspected circumferentially.  This showed bladder trabeculation otherwise unremarkable.   Description of procedure: The patient was properly identified in the holding area.  He received preoperative IV antibiotics.  He was taken to the operating room where MAC.  He is placed in the dorsolithotomy position.  Genitalia and perineum were prepped and draped.  Proper timeout was performed.   A 49F cystoscope was inserted into the bladder with findings as described above.  The 1st pair of implants were placed at the bladder neck ~1.5 cm from the bladder Neck. The 2nd pair of implants were placed at the level of the verumontanum.   A repeat cysto was performed and another pair of implants in  between the prior pairs.    A final cystoscopy was conducted first to inspect the location and state of each implant and second, to confirm the presence of a continuous anterior channel was present through the prostatic urethra with irrigation flow turned off.    6 implants were delivered in total.    Following this, the scope was removed.  After anesthetic reversal he was transported to the PACU in stable condition.  He tolerated the procedure well.   Plan: He will follow-up with me in 4 to 6 weeks for IPSS/PVR.

## 2021-11-01 NOTE — Anesthesia Postprocedure Evaluation (Signed)
Anesthesia Post Note  Patient: Dennis Bond  Procedure(s) Performed: CYSTOSCOPY WITH INSERTION OF UROLIFT (Prostate)  Patient location during evaluation: PACU Anesthesia Type: General Level of consciousness: awake and alert Pain management: pain level controlled Vital Signs Assessment: post-procedure vital signs reviewed and stable Respiratory status: spontaneous breathing, nonlabored ventilation, respiratory function stable and patient connected to nasal cannula oxygen Cardiovascular status: blood pressure returned to baseline and stable Postop Assessment: no apparent nausea or vomiting Anesthetic complications: no   No notable events documented.   Last Vitals:  Vitals:   11/01/21 1245 11/01/21 1255  BP: (!) 153/88 (!) 191/87  Pulse: (!) 51 60  Resp: 16 16  Temp: (!) 36.2 C (!) 36.1 C  SpO2: 100% 99%    Last Pain:  Vitals:   11/01/21 1255  TempSrc: Temporal  PainSc:                  Corinda Gubler

## 2021-11-01 NOTE — Anesthesia Preprocedure Evaluation (Addendum)
Anesthesia Evaluation  Patient identified by MRN, date of birth, ID band Patient awake    Reviewed: Allergy & Precautions, NPO status , Patient's Chart, lab work & pertinent test results  History of Anesthesia Complications Negative for: history of anesthetic complications  Airway Mallampati: II  TM Distance: >3 FB Neck ROM: Full    Dental  (+) Poor Dentition   Pulmonary neg pulmonary ROS, neg sleep apnea, neg COPD, Patient abstained from smoking.Not current smoker, former smoker,    Pulmonary exam normal breath sounds clear to auscultation       Cardiovascular Exercise Tolerance: Good METShypertension, Pt. on medications + CAD and + Cardiac Stents  (-) Past MI (-) dysrhythmias  Rhythm:Regular Rate:Normal - Systolic murmurs ? Exercise treadmill test performed on 09/03/2008 revealed ST elevation in the inferior and anterolateral leads.  Maximum heart rate achieved was 126 bpm.  Patient able to achieve a maximum workload of 10.1 METS.  ? Diagnostic left heart catheterization was performed on 09/05/2008 revealing a hyperdynamic left ventricular systolic function with an EF of 78%.  There was multivessel CAD; 95% proximal LAD, 95% mid LAD, and 90% RV marginal.  PCI of the proximal/mid LAD was performed placing 2 overlapping drug-eluting stents (type unknown).  ? Repeat exercise treadmill test performed on 01/26/2009 was found to be negative.  Patient achieved a heart rate of 133 bpm and a maximum workload of 13.1 METS.  ? TTE performed on 11/20/2009 revealed a normal left ventricular systolic function with an EF of >55%.  There was mild LVH and LAE.  Trivial atrial, mitral, and tricuspid valve regurgitation noted. Diastolic Doppler parameters consistent with abnormal relaxation (G1DD).  There was no evidence of a significant transvalvular gradient to suggest stenosis.   Neuro/Psych negative neurological ROS  negative psych ROS    GI/Hepatic neg GERD  ,(+)     (-) substance abuse  ,   Endo/Other  neg diabetes  Renal/GU negative Renal ROS     Musculoskeletal  (+) Arthritis ,   Abdominal   Peds  Hematology   Anesthesia Other Findings Past Medical History: No date: Allergic rhinitis due to pollen No date: Arthritis No date: BPH (benign prostatic hyperplasia) No date: Chronic back pain 09/05/2008: Coronary artery disease     Comment:  a.) LHC/PCI 09/05/2008: EF 78%, 95% pLAD, 95% mLAD, 90%               RV marginal --> 2 overlapping stents (unknown type)               placed to the p-mLAD No date: DDD (degenerative disc disease), lumbar 11/20/2009: Diastolic dysfunction     Comment:  a.) TTE 11/20/2009: EF >55%, mild LVH, mild LAE, triv               AR/MR/TR, G1DD No date: Hyperlipidemia No date: Hypertension No date: Insomnia No date: Lumbar spondylolysis No date: Medial epicondylitis No date: Osteoarthritis No date: Poor balance No date: PSVT (paroxysmal supraventricular tachycardia) (HCC) No date: Urge incontinence  Reproductive/Obstetrics                            Anesthesia Physical Anesthesia Plan  ASA: 3  Anesthesia Plan: General   Post-op Pain Management: Minimal or no pain anticipated   Induction: Intravenous  PONV Risk Score and Plan: 2 and Propofol infusion, TIVA and Ondansetron  Airway Management Planned: Nasal Cannula  Additional Equipment: None  Intra-op Plan:  Post-operative Plan:   Informed Consent: I have reviewed the patients History and Physical, chart, labs and discussed the procedure including the risks, benefits and alternatives for the proposed anesthesia with the patient or authorized representative who has indicated his/her understanding and acceptance.     Dental advisory given  Plan Discussed with: CRNA and Surgeon  Anesthesia Plan Comments: (Discussed risks of anesthesia with patient, including possibility of difficulty  with spontaneous ventilation under anesthesia necessitating airway intervention, PONV, and rare risks such as cardiac or respiratory or neurological events, and allergic reactions. Discussed the role of CRNA in patient's perioperative care. Patient understands.)        Anesthesia Quick Evaluation

## 2021-11-01 NOTE — Discharge Instructions (Addendum)
Urolift Post-Operative Instructions     Patient Expectations   1. Mild blood in your urine for about 1 week.  2. Urinary buring, frequency, and urgency for 10 days.  3. Mild pelvic pain 1-2 weeks.     Return to Activity     1. Drink water post procedure.  2. Take meds as needed.  Tylenol and/or Motrin is most helpful.  You may also by Pyridium/Azo over-the-counter for urinary burning.  3. No lifting or straining 48hrs.  4. Other activity when they feel up to it.  AMBULATORY SURGERY  DISCHARGE INSTRUCTIONS   The drugs that you were given will stay in your system until tomorrow so for the next 24 hours you should not:  Drive an automobile Make any legal decisions Drink any alcoholic beverage   You may resume regular meals tomorrow.  Today it is better to start with liquids and gradually work up to solid foods.  You may eat anything you prefer, but it is better to start with liquids, then soup and crackers, and gradually work up to solid foods.   Please notify your doctor immediately if you have any unusual bleeding, trouble breathing, redness and pain at the surgery site, drainage, fever, or pain not relieved by medication.    Additional Instructions:     Please contact your physician with any problems or Same Day Surgery at [phone], Monday through Friday 6 am to 4 pm, or [place] [facility] [place] Main number at [phone].  

## 2021-11-01 NOTE — Anesthesia Procedure Notes (Signed)
Procedure Name: MAC Date/Time: 11/01/2021 11:30 AM  Performed by: Jerrye Noble, CRNAPre-anesthesia Checklist: Patient identified, Emergency Drugs available, Suction available and Patient being monitored Patient Re-evaluated:Patient Re-evaluated prior to induction Oxygen Delivery Method: Simple face mask

## 2021-11-02 ENCOUNTER — Encounter: Payer: Self-pay | Admitting: Urology

## 2021-11-26 ENCOUNTER — Other Ambulatory Visit: Payer: Self-pay | Admitting: Cardiovascular Disease

## 2021-12-07 ENCOUNTER — Ambulatory Visit: Payer: Medicare HMO | Admitting: Urology

## 2021-12-07 ENCOUNTER — Encounter: Payer: Self-pay | Admitting: Urology

## 2021-12-07 VITALS — BP 130/75 | HR 65 | Ht 69.0 in | Wt 160.0 lb

## 2021-12-07 DIAGNOSIS — N138 Other obstructive and reflux uropathy: Secondary | ICD-10-CM

## 2021-12-07 DIAGNOSIS — N3941 Urge incontinence: Secondary | ICD-10-CM | POA: Diagnosis not present

## 2021-12-07 DIAGNOSIS — N401 Enlarged prostate with lower urinary tract symptoms: Secondary | ICD-10-CM | POA: Diagnosis not present

## 2021-12-07 LAB — BLADDER SCAN AMB NON-IMAGING

## 2021-12-07 MED ORDER — GEMTESA 75 MG PO TABS: 75.0000 mg | ORAL_TABLET | Freq: Every day | ORAL | 0 refills | Status: DC

## 2021-12-07 NOTE — Progress Notes (Signed)
12/07/2021 2:55 PM   Dennis Bond Jul 11, 1930 [id number]  Referring provider: [facility] [address] [place],  [place] [address]  Chief Complaint  Patient presents with   Urinary Incontinence   Post-op Follow-up    HPI: 88 year old male who presents today for follow-up after UroLift.  He underwent the procedure on 11/01/2021.  Postoperative course was uncomplicated.  He mentions today that he is not sure is very helpful.  He continues to have urgency and occasional urge incontinence.  He has to rush to the bathroom when he hears running water.  He had some dull pelvic pain and dysuria for up to 2 weeks and this subsided.  He is not taking any postoperative medications at this point in time.  He did notice an improvement in his stream and ability to empty.  His urgency frequency has not improved.  He does also have improvement in his post void dribbling.  No dysuria or gross hematuria.  PVR today 0.  He previously tried and failed oxybutynin.   IPSS     Row Name 12/07/21 1424         International Prostate Symptom Score   How often have you had the sensation of not emptying your bladder? Not at All     How often have you had to urinate less than every two hours? Less than half the time     How often have you found you stopped and started again several times when you urinated? Not at All     How often have you found it difficult to postpone urination? More than half the time     How often have you had a weak urinary stream? About half the time     How often have you had to strain to start urination? Not at All     How many times did you typically get up at night to urinate? 2 Times     Total IPSS Score 11              Score:  1-7 Mild 8-19 Moderate 20-35 Severe    PMH: Past Medical History:  Diagnosis Date   Allergic rhinitis due to pollen    Arthritis    BPH (benign prostatic hyperplasia)    Chronic back pain    Coronary artery disease  09/05/2008   a.) LHC/PCI 09/05/2008: EF 78%, 95% pLAD, 95% mLAD, 90% RV marginal --> 2 overlapping stents (unknown type) placed to the p-mLAD   DDD (degenerative disc disease), lumbar    Diastolic dysfunction 11/20/2009   a.) TTE 11/20/2009: EF >55%, mild LVH, mild LAE, triv AR/MR/TR, G1DD   Hyperlipidemia    Hypertension    Insomnia    Lumbar spondylolysis    Medial epicondylitis    Osteoarthritis    Poor balance    PSVT (paroxysmal supraventricular tachycardia) (HCC)    Urge incontinence     Surgical History: Past Surgical History:  Procedure Laterality Date   BACK SURGERY     CAROTID STENT     09/05/2008   CYSTOSCOPY WITH INSERTION OF UROLIFT N/A 11/01/2021   Procedure: CYSTOSCOPY WITH INSERTION OF UROLIFT;  Surgeon: Vanna Scotland, MD;  Location: [facility] ORS;  Service: Urology;  Laterality: N/A;    Home Medications:  Allergies as of 12/07/2021   No Known Allergies      Medication List        Accurate as of December 07, 2021  2:55 PM. If you have any questions,  ask your nurse or doctor.          acetaminophen 500 MG tablet Commonly known as: TYLENOL Take 1,000 mg by mouth every 6 (six) hours as needed.   aspirin EC 81 MG tablet Take by mouth.   atorvastatin 20 MG tablet Commonly known as: LIPITOR TAKE 1 TABLET DAILY   Gemtesa 75 MG Tabs Generic drug: Vibegron Take 75 mg by mouth daily at 6 (six) AM. Started by: Vanna Scotland, MD   ibuprofen 800 MG tablet Commonly known as: ADVIL Take by mouth.   lisinopril 20 MG tablet Commonly known as: ZESTRIL Take 1 tablet (20 mg total) by mouth daily.   metoprolol succinate 25 MG 24 hr tablet Commonly known as: TOPROL-XL TAKE 1 TABLET DAILY   PRESERVISION AREDS 2 PO Take by mouth daily.        Allergies: No Known Allergies  Family History: No family history on file.  Social History:  reports that he has quit smoking. He has never used smokeless tobacco. No history on file for alcohol use and drug  use.   Physical Exam: BP 130/75   Pulse 65   Ht 5\' 9"  (1.753 m)   Wt 160 lb (72.6 kg)   BMI 23.63 kg/m   Constitutional:  Alert and oriented, No acute distress.  Accompanied by his wife today. HEENT: [place] AT, moist mucus membranes.  Trachea midline, no masses. Skin: No rashes, bruises or suspicious lesions. Neurologic: Grossly intact, no focal deficits, moving all 4 extremities. Psychiatric: Normal mood and affect.  Laboratory Data: Lab Results  Component Value Date   WBC 7.5 10/25/2021   HGB 14.4 10/25/2021   HCT 42.8 10/25/2021   MCV 92.2 10/25/2021   PLT 213 10/25/2021    Lab Results  Component Value Date   CREATININE 0.93 10/25/2021    Assessment & Plan:    1. Benign prostatic hyperplasia with urinary obstruction Status post UroLift with improvement in obstructive urinary symptoms but persistent irritative urinary symptoms  Emptying well  Normal postprocedural course - Bladder Scan (Post Void Residual) in office - Vibegron (GEMTESA) 75 MG TABS; Take 75 mg by mouth daily at 6 (six) AM.  Dispense: 30 tablet; Refill: 0  2. Urge incontinence of urine We discussed trial of Gemtesa in the short-term for the next month, reassessment of urinary symptoms  Depending on the results, may or may not like to continue that point in time.  Ultimately, the goal would be to wean him from OAB type medication to see if his irritative urinary symptoms improve in the long run after relieving the obstruction.  In the short-term, we can treat his symptoms. - Bladder Scan (Post Void Residual) in office - Vibegron (GEMTESA) 75 MG TABS; Take 75 mg by mouth daily at 6 (six) AM.  Dispense: 30 tablet; Refill: 0   Return in about 4 weeks (around 01/04/2022) for IPSS/PVR.  03/06/2022, MD  [facility] [address], [address] [place], [place] [phone]

## 2022-01-11 ENCOUNTER — Ambulatory Visit: Payer: Medicare HMO | Admitting: Urology

## 2022-01-11 ENCOUNTER — Encounter: Payer: Self-pay | Admitting: Urology

## 2022-01-11 VITALS — BP 144/83 | HR 67 | Ht 69.0 in | Wt 161.0 lb

## 2022-01-11 DIAGNOSIS — N401 Enlarged prostate with lower urinary tract symptoms: Secondary | ICD-10-CM | POA: Diagnosis not present

## 2022-01-11 DIAGNOSIS — N3941 Urge incontinence: Secondary | ICD-10-CM | POA: Diagnosis not present

## 2022-01-11 DIAGNOSIS — N138 Other obstructive and reflux uropathy: Secondary | ICD-10-CM | POA: Diagnosis not present

## 2022-01-11 LAB — BLADDER SCAN AMB NON-IMAGING

## 2022-01-11 NOTE — Progress Notes (Unsigned)
01/11/2022 8:56 AM   [place] 11-Sep-1930 [id number]  Referring provider: [place] [address] [place],  [place] [address]  Chief Complaint  Patient presents with   Benign Prostatic Hypertrophy    HPI: 88 year old male with urinary frequency and nocturia who returns today for 1 month follow-up.  He underwent UroLift on 11/01/2021 with improvement of obstructive urinary symptoms but continued to have significant irritative symptoms.  Bladder scan was minimal.  He was given a month of Gemtesa 75 mg for the past 4 weeks and returns today for reevaluation of symptoms.  He reports today that he feels like his overall bladder capacity has improved.  He has a good strong void especially in the mornings after he exercises.  He feels like he is emptying his bladder well.  He continues to struggle with urinary urgency especially when brushing his teeth or washing his hands.  He also still gets up fairly frequently at nighttime but has devise a strategy where he goes to bed at 10, plans to wake up around midnight, takes a melatonin and is able to sleep till 5 AM which is his normal wake up time.  This is satisfactory to him.  He is not sure if the Logan Bores is helping him, he has been taking it with food and with his reading instructions, states that if she was to take it differently.  He would like to try it again and modify how and when he takes the medication.  He is requesting more samples today.   IPSS     Row Name 01/11/22 1300         International Prostate Symptom Score   How often have you had the sensation of not emptying your bladder? Not at All     How often have you had to urinate less than every two hours? About half the time     How often have you found you stopped and started again several times when you urinated? Not at All     How often have you found it difficult to postpone urination? About half the time     How often have you had a weak urinary  stream? About half the time     How often have you had to strain to start urination? Not at All     How many times did you typically get up at night to urinate? 5 Times     Total IPSS Score 14       Quality of Life due to urinary symptoms   If you were to spend the rest of your life with your urinary condition just the way it is now how would you feel about that? Unhappy              Score:  1-7 Mild 8-19 Moderate 20-35 Severe   PMH: Past Medical History:  Diagnosis Date   Allergic rhinitis due to pollen    Arthritis    BPH (benign prostatic hyperplasia)    Chronic back pain    Coronary artery disease 09/05/2008   a.) LHC/PCI 09/05/2008: EF 78%, 95% pLAD, 95% mLAD, 90% RV marginal --> 2 overlapping stents (unknown type) placed to the p-mLAD   DDD (degenerative disc disease), lumbar    Diastolic dysfunction 51/04/5850   a.) TTE 11/20/2009: EF >55%, mild LVH, mild LAE, triv AR/MR/TR, G1DD   Hyperlipidemia    Hypertension    Insomnia    Lumbar spondylolysis    Medial epicondylitis  Osteoarthritis    Poor balance    PSVT (paroxysmal supraventricular tachycardia)    Urge incontinence     Surgical History: Past Surgical History:  Procedure Laterality Date   BACK SURGERY     CAROTID STENT     09/05/2008   CYSTOSCOPY WITH INSERTION OF UROLIFT N/A 11/01/2021   Procedure: CYSTOSCOPY WITH INSERTION OF UROLIFT;  Surgeon: Vanna Scotland, MD;  Location: [facility] ORS;  Service: Urology;  Laterality: N/A;    Home Medications:  Allergies as of 01/11/2022   No Known Allergies      Medication List        Accurate as of January 11, 2022 11:59 PM. If you have any questions, ask your nurse or doctor.          acetaminophen 500 MG tablet Commonly known as: TYLENOL Take 1,000 mg by mouth every 6 (six) hours as needed.   aspirin EC 81 MG tablet Take by mouth.   atorvastatin 20 MG tablet Commonly known as: LIPITOR TAKE 1 TABLET DAILY   Gemtesa 75 MG Tabs Generic  drug: Vibegron Take 75 mg by mouth daily at 6 (six) AM.   ibuprofen 800 MG tablet Commonly known as: ADVIL Take by mouth.   lisinopril 20 MG tablet Commonly known as: ZESTRIL Take 1 tablet (20 mg total) by mouth daily.   metoprolol succinate 25 MG 24 hr tablet Commonly known as: TOPROL-XL TAKE 1 TABLET DAILY   PRESERVISION AREDS 2 PO Take by mouth daily.        Allergies: No Known Allergies  Family History: No family history on file.  Social History:  reports that he has quit smoking. He has never used smokeless tobacco. No history on file for alcohol use and drug use.   Physical Exam: BP (!) 144/83   Pulse 67   Ht 5\' 9"  (1.753 m)   Wt 161 lb (73 kg)   BMI 23.78 kg/m   Constitutional:  Alert and oriented, No acute distress. HEENT: [place] AT, moist mucus membranes.  Trachea midline, no masses. Cardiovascular: No clubbing, cyanosis, or edema. Neurologic: Grossly intact, no focal deficits, moving all 4 extremities. Psychiatric: Normal mood and affect.  Laboratory Data: Lab Results  Component Value Date   WBC 7.5 10/25/2021   HGB 14.4 10/25/2021   HCT 42.8 10/25/2021   MCV 92.2 10/25/2021   PLT 213 10/25/2021    Lab Results  Component Value Date   CREATININE 0.93 10/25/2021   Results for orders placed or performed in visit on 01/11/22  Bladder Scan (Post Void Residual) in office  Result Value Ref Range   Scan Result 01/13/22      Assessment & Plan:    1. Benign prostatic hyperplasia with urinary obstruction Adequate emptying with improved obstructive symptoms status post UroLift - Bladder Scan (Post Void Residual) in office  2. Urge incontinence of urine Continues to struggle with urge incontinence, primarily triggered by running water   An additional 6 weeks of Gemtesa 75 mg were given today, he will call and let us know if he like to continue this prescription   F/u TBD based on above  [place], MD  [facility] [address], [address] [place], [place] [phone]

## 2022-02-07 NOTE — Progress Notes (Unsigned)
Cardiology Office Note  Date:  02/08/2022   ID:  Gaven, Eugene 1930-12-06, MRN [id number]  PCP:  [facility]   Chief Complaint  Patient presents with   12 month follow up     "Doing well." Medications reviewed by the patient verbally.     HPI:  [name] is a 88 yo male with PMH of CAD  09/05/2008: Cardiac cath [facility]). EF 78%, 95% prox LAD, 95% mid LAD. RV marginal 90%. Status post successful PCI to prox/mid LAD with two overlapping DES.  hyperlipidemia hypertension.  back pain Who presents for f/u of his coronary disease, prior stenting  LOV 9/22 In follow-up today reports that he feels well, active, exercising on a regular basis Wonders why he needs to be on his medications Does not feel that they are doing anything Sometimes feels that his blood pressure is too low but does not have numbers to discuss Denies any chest pain or shortness of breath on exertion  Completed cystoscopy with insertion of UroLift   Now on Gemtesa Reports that he continues to have urinary issues  On prior office visit reported having "Bad back", chronic pain issues Lives at [facility]  coronary intervention in 2010 Denies having anginal symptoms since then   on Lipitor 40 daily  total cholesterol 149 LDL 60 in 2022, no lab work done 2023  EKG personally reviewed by myself on todays visit Normal sinus rhythm rate 65 bpm no significant ST-T wave changes, baseline artifact noted  Remote stress test   echocardiogram was done in 2011 that shows normal ejection fraction.  Other past medical history reviewed a. 09/03/2008: Exercise treadmill test. EKG positive in inferior/anterolateral leads with ST elevation in AVR, V1. Reproduced 08/2008 chest pain. Max HR 126, 10.1 METS.  b. 09/05/2008: Cardiac cath [facility]). EF 78%, 95% prox LAD, 95% mid LAD. RV marginal 90%. Status post successful PCI to prox/mid LAD with two overlapping DES.  c. Completed three month cardiac rehab [facility]  d. 01/26/2009: Exercise treadmill test [facility]). Negative adequate to max HR 133 bpm, 13.1 METS. No chest pain or arrhythmias.  D. 11/2009: ECHO: EF > 55%, trivial valvular disease  a. 01/06/2006:TC 245, TG 49, HDL 66, LDL 169.2  b. 02/02/2009: TC 143, TG 44, HDL 70, LDL 64  c. 11/16/2009: TC 148, TG 102, HDL 66, LDL 61  d. 08/17/10: TC 158, TG 49, HDL 75, LDL 73. E. 08/01/2011: TC 161, Tg 62, HDL 67, LDL 82 on Atorvastatin 40mg m F. April 2014 TC 161, TG 62, HDL 67, LDL 82 - Lipitor 20mg  G. 2017 TC 141, TG 89, HDL 49, LDL 74 - Lipitor 20mg  H. 2018 TC 151, TG 152, HDL 64, LDL 57 - Lipitor 20mg  I. 2020 TC 162, TG 84, HDL 82, LDL 82 - Lipitor 20mg   PMH:   has a past medical history of Allergic rhinitis due to pollen, Arthritis, BPH (benign prostatic hyperplasia), Chronic back pain, Coronary artery disease (09/05/2008), DDD (degenerative disc disease), lumbar, Diastolic dysfunction (11/20/2009), Hyperlipidemia, Hypertension, Insomnia, Lumbar spondylolysis, Medial epicondylitis, Osteoarthritis, Poor balance, PSVT (paroxysmal supraventricular tachycardia), and Urge incontinence.  PSH:    Past Surgical History:  Procedure Laterality Date   BACK SURGERY     CAROTID STENT     09/05/2008   CYSTOSCOPY WITH INSERTION OF UROLIFT N/A 11/01/2021   Procedure: CYSTOSCOPY WITH INSERTION OF UROLIFT;  Surgeon: , MD;  Location: [facility] ORS;  Service: Urology;  Laterality: N/A;  Current Outpatient Medications  Medication Sig Dispense Refill   aspirin 81 MG EC tablet Take 81 mg by mouth daily.     atorvastatin (LIPITOR) 20 MG tablet TAKE 1 TABLET DAILY 90 tablet 0   lisinopril (ZESTRIL) 20 MG tablet Take 1 tablet (20 mg total) by mouth daily. 90 tablet 3   metoprolol succinate (TOPROL-XL) 25 MG 24 hr tablet TAKE 1 TABLET DAILY 90 tablet 0   Multiple Vitamins-Minerals (PRESERVISION AREDS 2 PO) Take by mouth daily.     acetaminophen (TYLENOL) 500 MG tablet Take 1,000 mg by  mouth every 6 (six) hours as needed. (Patient not taking: Reported on 02/08/2022)     ibuprofen (ADVIL) 800 MG tablet Take by mouth. (Patient not taking: Reported on 02/08/2022)     Vibegron (GEMTESA) 75 MG TABS Take 75 mg by mouth daily at 6 (six) AM. (Patient not taking: Reported on 02/08/2022) 30 tablet 0   No current facility-administered medications for this visit.     Allergies:   Patient has no known allergies.   Social History:  The patient  reports that he has quit smoking. He has never used smokeless tobacco. He reports that he does not drink alcohol and does not use drugs.   Family History:   family history is not on file.    Review of Systems: Review of Systems  Constitutional: Negative.   HENT: Negative.    Respiratory: Negative.    Cardiovascular: Negative.   Gastrointestinal: Negative.   Musculoskeletal: Negative.   Neurological: Negative.   Psychiatric/Behavioral: Negative.    All other systems reviewed and are negative.   PHYSICAL EXAM: VS:  BP 118/64 (BP Location: Left Arm, Patient Position: Sitting, Cuff Size: Normal)   Pulse 65   Ht 5\' 9"  (1.753 m)   Wt 160 lb (72.6 kg)   SpO2 98%   BMI 23.63 kg/m  , BMI Body mass index is 23.63 kg/m. Constitutional:  oriented to person, place, and time. No distress.  HENT:  Head: Grossly normal Eyes:  no discharge. No scleral icterus.  Neck: No JVD, no carotid bruits  Cardiovascular: Regular rate and rhythm, no murmurs appreciated Pulmonary/Chest: Clear to auscultation bilaterally, no wheezes or rails Abdominal: Soft.  no distension.  no tenderness.  Musculoskeletal: Normal range of motion Neurological:  normal muscle tone. Coordination normal. No atrophy Skin: Skin warm and dry Psychiatric: normal affect, pleasant  Recent Labs: 10/25/2021: ALT 23; BUN 27; Creatinine, Ser 0.93; Hemoglobin 14.4; Platelets 213; Potassium 4.3; Sodium 138    Lipid Panel No results found for: "CHOL", "HDL", "LDLCALC", "TRIG"     Wt Readings from Last 3 Encounters:  02/08/22 160 lb (72.6 kg)  01/11/22 161 lb (73 kg)  12/07/21 160 lb (72.6 kg)     ASSESSMENT AND PLAN:  Problem List Items Addressed This Visit       Cardiology Problems   Coronary artery disease involving native coronary artery of native heart - Primary   Relevant Orders   EKG 12-Lead   Mixed hyperlipidemia   Relevant Orders   EKG 12-Lead   Paroxysmal supraventricular tachycardia   Other Visit Diagnoses     Benign essential HTN       Relevant Orders   EKG 12-Lead     Coronary artery disease with stable angina  Currently with no symptoms of angina. No further workup at this time. Continue current medication regimen. Long discussion concerning the 4 medications that he is taking and importance of staying on these medications Recommended  continued exercise at [facility]  Essential hypertension Blood pressure is well controlled on today's visit. No changes made to the medications.  Recommend he stay on both lisinopril and metoprolol For low blood pressure would hydrate  Hyperlipidemia Recommend routine lab work done with primary care on annual basis Encouraged him to stay on his Lipitor Goal LDL less than 70   Total encounter time more than 30 minutes  Greater than 50% was spent in counseling and coordination of care with the patient   Signed, Dossie Arbour, M.D., Ph.D. [facility] [place], [place] [phone]

## 2022-02-08 ENCOUNTER — Ambulatory Visit: Payer: Medicare HMO | Attending: Cardiovascular Disease | Admitting: Cardiovascular Disease

## 2022-02-08 ENCOUNTER — Encounter: Payer: Self-pay | Admitting: Cardiovascular Disease

## 2022-02-08 VITALS — BP 118/64 | HR 65 | Ht 69.0 in | Wt 160.0 lb

## 2022-02-08 DIAGNOSIS — I25118 Atherosclerotic heart disease of native coronary artery with other forms of angina pectoris: Secondary | ICD-10-CM | POA: Diagnosis not present

## 2022-02-08 DIAGNOSIS — I471 Supraventricular tachycardia, unspecified: Secondary | ICD-10-CM

## 2022-02-08 DIAGNOSIS — E782 Mixed hyperlipidemia: Secondary | ICD-10-CM

## 2022-02-08 DIAGNOSIS — I10 Essential (primary) hypertension: Secondary | ICD-10-CM

## 2022-02-08 NOTE — Patient Instructions (Signed)
Medication Instructions:  No changes  If you need a refill on your cardiac medications before your next appointment, please call your pharmacy.   Lab work: No new labs needed  Testing/Procedures: No new testing needed  Follow-Up: At [facility], you and your health needs are our priority.  As part of our continuing mission to provide you with exceptional heart care, we have created designated Provider Care Teams.  These Care Teams include your primary Cardiologist (physician) and Advanced Practice Providers (APPs -  Physician Assistants and Nurse Practitioners) who all work together to provide you with the care you need, when you need it.  You will need a follow up appointment in 12 months  Providers on your designated Care Team:   Christopher Berge, NP Ryan Dunn, PA-C Cadence Furth, PA-C  COVID-19 Vaccine Information can be found at: [email or web address] For questions related to vaccine distribution or appointments, please email [email or web address] or call [phone]   

## 2022-02-22 ENCOUNTER — Other Ambulatory Visit: Payer: Self-pay | Admitting: Cardiovascular Disease

## 2022-05-30 ENCOUNTER — Emergency Department
Admission: EM | Admit: 2022-05-30 | Discharge: 2022-05-30 | Disposition: A | Discharge status: Home / Self Care | Payer: Medicare HMO | Attending: Emergency Medicine | Admitting: Emergency Medicine

## 2022-05-30 ENCOUNTER — Other Ambulatory Visit: Payer: Self-pay

## 2022-05-30 ENCOUNTER — Emergency Department: Payer: Medicare HMO

## 2022-05-30 DIAGNOSIS — Z79899 Other long term (current) drug therapy: Secondary | ICD-10-CM | POA: Insufficient documentation

## 2022-05-30 DIAGNOSIS — Z7982 Long term (current) use of aspirin: Secondary | ICD-10-CM | POA: Insufficient documentation

## 2022-05-30 DIAGNOSIS — I2510 Atherosclerotic heart disease of native coronary artery without angina pectoris: Secondary | ICD-10-CM | POA: Diagnosis not present

## 2022-05-30 DIAGNOSIS — I110 Hypertensive heart disease with heart failure: Secondary | ICD-10-CM | POA: Diagnosis not present

## 2022-05-30 DIAGNOSIS — I5030 Unspecified diastolic (congestive) heart failure: Secondary | ICD-10-CM | POA: Diagnosis not present

## 2022-05-30 DIAGNOSIS — R569 Unspecified convulsions: Secondary | ICD-10-CM

## 2022-05-30 LAB — CBC WITH DIFFERENTIAL/PLATELET
Abs Immature Granulocytes: 0.02 10*3/uL (ref 0.00–0.07)
Basophils Absolute: 0 10*3/uL (ref 0.0–0.1)
Basophils Relative: 0 %
Eosinophils Absolute: 0.2 10*3/uL (ref 0.0–0.5)
Eosinophils Relative: 3 %
HCT: 42.1 % (ref 39.0–52.0)
Hemoglobin: 14.3 g/dL (ref 13.0–17.0)
Immature Granulocytes: 0 %
Lymphocytes Relative: 28 %
Lymphs Abs: 1.9 10*3/uL (ref 0.7–4.0)
MCH: 31.6 pg (ref 26.0–34.0)
MCHC: 34 g/dL (ref 30.0–36.0)
MCV: 93.1 fL (ref 80.0–100.0)
Monocytes Absolute: 0.5 10*3/uL (ref 0.1–1.0)
Monocytes Relative: 7 %
Neutro Abs: 4.2 10*3/uL (ref 1.7–7.7)
Neutrophils Relative %: 62 %
Platelets: 187 10*3/uL (ref 150–400)
RBC: 4.52 MIL/uL (ref 4.22–5.81)
RDW: 11.5 % (ref 11.5–15.5)
WBC: 6.8 10*3/uL (ref 4.0–10.5)
nRBC: 0 % (ref 0.0–0.2)

## 2022-05-30 LAB — URINE DRUG SCREEN, QUALITATIVE (ARMC ONLY)
Amphetamines, Ur Screen: NOT DETECTED
Barbiturates, Ur Screen: NOT DETECTED
Benzodiazepine, Ur Scrn: NOT DETECTED
Cannabinoid 50 Ng, Ur ~~LOC~~: NOT DETECTED
Cocaine Metabolite,Ur ~~LOC~~: NOT DETECTED
MDMA (Ecstasy)Ur Screen: NOT DETECTED
Methadone Scn, Ur: NOT DETECTED
Opiate, Ur Screen: NOT DETECTED
Phencyclidine (PCP) Ur S: NOT DETECTED
Tricyclic, Ur Screen: NOT DETECTED

## 2022-05-30 LAB — COMPREHENSIVE METABOLIC PANEL
ALT: 18 U/L (ref 0–44)
AST: 34 U/L (ref 15–41)
Albumin: 3.6 g/dL (ref 3.5–5.0)
Alkaline Phosphatase: 52 U/L (ref 38–126)
Anion gap: 9 (ref 5–15)
BUN: 23 mg/dL (ref 8–23)
CO2: 23 mmol/L (ref 22–32)
Calcium: 8.6 mg/dL — ABNORMAL LOW (ref 8.9–10.3)
Chloride: 107 mmol/L (ref 98–111)
Creatinine, Ser: 0.89 mg/dL (ref 0.61–1.24)
GFR, Estimated: >60 mL/min (ref >60)
Glucose, Bld: 99 mg/dL (ref 70–99)
Potassium: 4.5 mmol/L (ref 3.5–5.1)
Sodium: 139 mmol/L (ref 135–145)
Total Bilirubin: 1.2 mg/dL (ref 0.3–1.2)
Total Protein: 6.6 g/dL (ref 6.5–8.1)

## 2022-05-30 LAB — URINALYSIS, ROUTINE W REFLEX MICROSCOPIC
Bilirubin Urine: NEGATIVE
Glucose, UA: NEGATIVE mg/dL
Hgb urine dipstick: NEGATIVE
Ketones, ur: NEGATIVE mg/dL
Leukocytes,Ua: NEGATIVE
Nitrite: NEGATIVE
Protein, ur: NEGATIVE mg/dL
Specific Gravity, Urine: 1.014 (ref 1.005–1.030)
pH: 5 (ref 5.0–8.0)

## 2022-05-30 LAB — ETHANOL: Alcohol, Ethyl (B): <10 mg/dL (ref <10)

## 2022-05-30 LAB — CBG MONITORING, ED: Glucose-Capillary: 99 mg/dL (ref 70–99)

## 2022-05-30 MED ORDER — LEVETIRACETAM 500 MG PO TABS: 500.0000 mg | ORAL_TABLET | Freq: Two times a day (BID) | ORAL | 1 refills | Status: DC

## 2022-05-30 MED ORDER — LEVETIRACETAM IN NACL 1000 MG/100ML IV SOLN
1000.0000 mg | Freq: Once | INTRAVENOUS | Status: AC
  Administered 2022-05-30: 1000 mg via INTRAVENOUS
  Filled 2022-05-30: qty 100

## 2022-05-30 NOTE — ED Provider Notes (Signed)
[facility] Provider Note    Event Date/Time   First MD Initiated Contact with Patient 05/30/22 [phone]     (approximate)   History   Seizures   HPI  Dennis Bond is a 89 y.o. male with history of CAD, diastolic heart failure, hypertension, hyperlipidemia who presents to the emergency department with generalized tonic-clonic seizure-like activity while in bed.  Woke his wife from sleep.  He did bite his tongue and was postictal with EMS but is now alert and oriented x 3.  Denies any recent infectious symptoms, headache, head trauma, neurologic deficits.  Reportedly has had 1 seizure previously but not on seizure medications.   History provided by patient, wife, EMS.    Past Medical History:  Diagnosis Date   Allergic rhinitis due to pollen    Arthritis    BPH (benign prostatic hyperplasia)    Chronic back pain    Coronary artery disease 09/05/2008   a.) LHC/PCI 09/05/2008: EF 78%, 95% pLAD, 95% mLAD, 90% RV marginal --> 2 overlapping stents (unknown type) placed to the p-mLAD   DDD (degenerative disc disease), lumbar    Diastolic dysfunction [id number]   a.) TTE 11/20/2009: EF >55%, mild LVH, mild LAE, triv AR/MR/TR, G1DD   Hyperlipidemia    Hypertension    Insomnia    Lumbar spondylolysis    Medial epicondylitis    Osteoarthritis    Poor balance    PSVT (paroxysmal supraventricular tachycardia)    Urge incontinence     Past Surgical History:  Procedure Laterality Date   BACK SURGERY     CAROTID STENT     09/05/2008   CYSTOSCOPY WITH INSERTION OF UROLIFT N/A 11/01/2021   Procedure: CYSTOSCOPY WITH INSERTION OF UROLIFT;  Surgeon: Hollice Espy, MD;  Location: [facility] ORS;  Service: Urology;  Laterality: N/A;    MEDICATIONS:  Prior to Admission medications   Medication Sig Start Date End Date Taking? Authorizing Provider  acetaminophen (TYLENOL) 500 MG tablet Take 1,000 mg by mouth every 6 (six) hours as needed. Patient not taking: Reported  on 02/08/2022    [provider]  aspirin 81 MG EC tablet Take 81 mg by mouth daily.    [provider]  atorvastatin (LIPITOR) 20 MG tablet TAKE 1 TABLET DAILY 02/22/22   Minna Merritts, MD  ibuprofen (ADVIL) 800 MG tablet Take by mouth. Patient not taking: Reported on 02/08/2022 06/25/19   [provider]  lisinopril (ZESTRIL) 20 MG tablet Take 1 tablet (20 mg total) by mouth daily. 12/01/20   Minna Merritts, MD  metoprolol succinate (TOPROL-XL) 25 MG 24 hr tablet TAKE 1 TABLET DAILY 02/22/22   Minna Merritts, MD  Multiple Vitamins-Minerals (PRESERVISION AREDS 2 PO) Take by mouth daily.    [provider]  Vibegron (GEMTESA) 75 MG TABS Take 75 mg by mouth daily at 6 (six) AM. Patient not taking: Reported on 02/08/2022 12/07/21   Hollice Espy, MD    Physical Exam   Triage Vital Signs: ED Triage Vitals  Enc Vitals Group     BP 05/30/22 0520 (!) 177/86     Pulse Rate 05/30/22 0520 78     Resp 05/30/22 0520 15     Temp --      Temp src --      SpO2 05/30/22 0520 92 %     Weight --      Height --      Head Circumference --  Peak Flow --      Pain Score 05/30/22 0521 0     Pain Loc --      Pain Edu? --      Excl. in [place]? --     Most recent vital signs: Vitals:   05/30/22 0520 05/30/22 0623  BP: (!) 177/86   Pulse: 78   Resp: 15   Temp:  98.4 F (36.9 C)  SpO2: 92%     CONSTITUTIONAL: Alert, oriented x 3, responds appropriately to questions. Well-appearing; well-nourished HEAD: Normocephalic, atraumatic EYES: Conjunctivae clear, pupils appear equal, sclera nonicteric ENT: normal nose; moist mucous membranes, superficial right tongue laceration that is hemostatic, no dental injury NECK: Supple, normal ROM midline spinal tenderness or step-off or deformity CARD: RRR; S1 and S2 appreciated RESP: Normal chest excursion without splinting or tachypnea; breath sounds clear and equal bilaterally; no wheezes, no rhonchi, no rales, no  hypoxia or respiratory distress, speaking full sentences ABD/GI: Non-distended; soft, non-tender, no rebound, no guarding, no peritoneal signs BACK: The back appears normal no midline spinal tenderness or step-off or deformity EXT: Normal ROM in all joints; no deformity noted, no edema SKIN: Normal color for age and race; warm; no rash on exposed skin NEURO: Moves all extremities equally, normal speech, no facial asymmetry, normal sensation PSYCH: The patient's mood and manner are appropriate.   ED Results / Procedures / Treatments   LABS: (all labs ordered are listed, but only abnormal results are displayed) Labs Reviewed  COMPREHENSIVE METABOLIC PANEL - Abnormal; Notable for the following components:      Result Value   Calcium 8.6 (*)    All other components within normal limits  URINALYSIS, ROUTINE W REFLEX MICROSCOPIC - Abnormal; Notable for the following components:   Color, Urine YELLOW (*)    APPearance CLEAR (*)    All other components within normal limits  CBC WITH DIFFERENTIAL/PLATELET  ETHANOL  URINE DRUG SCREEN, QUALITATIVE ([facility] ONLY)  CBG MONITORING, ED     EKG:  EKG Interpretation  Date/Time:  Monday May 30 2022 05:16:44 EST Ventricular Rate:  83 PR Interval:  191 QRS Duration: 93 QT Interval:  371 QTC Calculation: 436 R Axis:   23 Text Interpretation: Sinus rhythm Atrial premature complex RSR' in V1 or V2, probably normal variant Confirmed by Pryor Curia [phone]) on 05/30/2022 5:31:49 AM         RADIOLOGY: My personal review and interpretation of imaging: CT head unremarkable.  I have personally reviewed all radiology reports.   CT HEAD WO CONTRAST (5MM)  Result Date: 05/30/2022 CLINICAL DATA:  89 year old male with history of new onset of seizure. EXAM: CT HEAD WITHOUT CONTRAST TECHNIQUE: Contiguous axial images were obtained from the base of the skull through the vertex without intravenous contrast. RADIATION DOSE REDUCTION: This exam was  performed according to the departmental dose-optimization program which includes automated exposure control, adjustment of the mA and/or kV according to patient size and/or use of iterative reconstruction technique. COMPARISON:  Head CT 06/27/2021. FINDINGS: Brain: Mild cerebral atrophy. Patchy and confluent areas of decreased attenuation are noted throughout the deep and periventricular white matter of the cerebral hemispheres bilaterally, compatible with chronic microvascular ischemic disease. Old lacunar infarct in the right basal ganglia incidentally noted, unchanged. No evidence of acute infarction, hemorrhage, hydrocephalus, extra-axial collection or mass lesion/mass effect. Vascular: No hyperdense vessel or unexpected calcification. Skull: Normal. Negative for fracture or focal lesion. Sinuses/Orbits: No acute finding. Other: None. IMPRESSION: 1. No acute intracranial abnormalities. 2. Mild  cerebral atrophy with chronic microvascular ischemic changes and old right basal ganglia lacunar infarct, similar to the prior study. Electronically Signed   By: Vinnie Langton M.D.   On: 05/30/2022 06:25     PROCEDURES:  Critical Care performed: No      .1-3 Lead EKG Interpretation  Performed by: Kamala Kolton, Delice Bison, DO Authorized by: Shareena Nusz, Delice Bison, DO     Interpretation: normal     ECG rate:  78   ECG rate assessment: normal     Rhythm: sinus rhythm     Ectopy: none     Conduction: normal       IMPRESSION / MDM / ASSESSMENT AND PLAN / ED COURSE  I reviewed the triage vital signs and the nursing notes.    Patient here after seizure-like activity.  Postictal with EMS but now back to baseline.  No recent inciting symptoms.  Reportedly has had a seizure previously and not on antiepileptics.  The patient is on the cardiac monitor to evaluate for evidence of arrhythmia and/or significant heart rate changes.   DIFFERENTIAL DIAGNOSIS (includes but not limited to):   Seizure, intracranial  hemorrhage, stroke, UTI, electrolyte derangement, anemia, dehydration   Patient's presentation is most consistent with acute presentation with potential threat to life or bodily function.   PLAN: Will obtain CBC, CMP, urinalysis, urine drug screen, ethanol level, CT head, EKG.  Will give IV Keppra.   MEDICATIONS GIVEN IN ED: Medications  levETIRAcetam (KEPPRA) IVPB 1000 mg/100 mL premix (1,000 mg Intravenous New Bag/Given 05/30/22 0540)     ED COURSE: Patient's labs unremarkable.  Normal hemoglobin, white blood cell count, electrolytes, renal function, LFTs.  Urine shows no UTI or dehydration.  Drug screen negative.  CT head shows no acute abnormality when reviewed and interpreted by myself and the radiologist.  Patient still at his baseline without further seizure-like activity.  Will start him on Keppra 500 mg twice daily and have him follow-up closely with neurology as an outpatient.  Patient and wife are comfortable with this plan.   At this time, I do not feel there is any life-threatening condition present. I reviewed all nursing notes, vitals, pertinent previous records.  All lab and urine results, EKGs, imaging ordered have been independently reviewed and interpreted by myself.  I reviewed all available radiology reports from any imaging ordered this visit.  Based on my assessment, I feel the patient is safe to be discharged home without further emergent workup and can continue workup as an outpatient as needed. Discussed all findings, treatment plan as well as usual and customary return precautions.  They verbalize understanding and are comfortable with this plan.  Outpatient follow-up has been provided as needed.  All questions have been answered.    CONSULTS:  none   OUTSIDE RECORDS REVIEWED: Reviewed last PCP note with Adrian Prows on 11/24/2021.       FINAL CLINICAL IMPRESSION(S) / ED DIAGNOSES   Final diagnoses:  Seizure-like activity ([place])     Rx / DC Orders    ED Discharge Orders          Ordered    levETIRAcetam (KEPPRA) 500 MG tablet  2 times daily,   Status:  Discontinued        05/30/22 0635    levETIRAcetam (KEPPRA) 500 MG tablet  2 times daily        05/30/22 [id number]             Note:  This document was prepared using  [employer] forces training and education officer and may include unintentional dictation errors.   Jenea Dake, Delice Bison, DO 05/30/22 [id number]

## 2022-05-30 NOTE — Discharge Instructions (Signed)
No driving for 6 months after most recent seizure or spell, per [place] law.   Avoid activities that are potentially dangerous if you were to have another seizure, including operating heavy machinery, swimming, taking baths, climbing heights. Please use direct supervision around stoves, ovens, fireplaces, campfires, or other sources of heat or fire.    Make sure you are taking medications as directed, avoiding alcohol and drug use, getting adequate sleep and eating regular meals.

## 2022-05-30 NOTE — ED Triage Notes (Signed)
Pt arrives via EMS with c/o seizure. Pt woke wife up from her sleep due to shaking. EMS unable to advise how long seizure occurred, denies any seizure-like activity during transport. EMS also advised that pt was postictal the entire time during transport. Upon arrival pt A&O x4, blood noted to the tongue. Pt unable to advise what occurred. 20 ga placed in right ac via EMS.

## 2022-11-03 ENCOUNTER — Encounter: Payer: Self-pay | Admitting: Urology

## 2022-11-22 NOTE — Progress Notes (Signed)
11/23/2022 6:06 PM   Dennis Bond 01-27-1931 [id number]  Referring provider: [facility] [address] [place],  [place] [address]  Urological history: 1. Urge incontinence -contributing factors of age, HTN, lumbar disc disease, arthritis and BPH.  2. BPH with LU TS -cysto (07/2021) - moderate to severe trabeculation -TRUS (07/2021) - 38 cc -UroLift (10/2021) - 6 implants  Chief Complaint  Patient presents with   Follow-up   HPI: Dennis Bond is a 89 y.o. male who presents today for urge incontinence and nocturia with his wife, Dennis Bond.    Previous records reviewed.   I PSS 17/5  PVR 13 mL   He is needing to use the restroom approximately every hour both day and night.  This has been going on for over a year.  He was given a trial of Gemtesa, but he states it was ineffective.  He drinks to have a cup of coffee in the morning and have a couple coffee in the afternoon.  At lunchtime he drinks a beer.  He does drink vitamin water in the morning as well.  The rest the day it is water.  He is even tried to avoid meat thinking that they was causing issues, but it did not relieve his symptoms.   Patient denies any modifying or aggravating factors.  Patient denies any recent UTI's, gross hematuria, dysuria or suprapubic/flank pain.  Patient denies any fevers, chills, nausea or vomiting.    IPSS     Row Name 11/23/22 1400         International Prostate Symptom Score   How often have you had the sensation of not emptying your bladder? Not at All     How often have you had to urinate less than every two hours? Almost always     How often have you found you stopped and started again several times when you urinated? Not at All     How often have you found it difficult to postpone urination? Almost always     How often have you had a weak urinary stream? More than half the time     How often have you had to strain to start urination? Not at All     How many times did  you typically get up at night to urinate? 3 Times     Total IPSS Score 17       Quality of Life due to urinary symptoms   If you were to spend the rest of your life with your urinary condition just the way it is now how would you feel about that? Unhappy              Score:  1-7 Mild 8-19 Moderate 20-35 Severe   PMH: Past Medical History:  Diagnosis Date   Allergic rhinitis due to pollen    Arthritis    BPH (benign prostatic hyperplasia)    Chronic back pain    Coronary artery disease 09/05/2008   a.) LHC/PCI 09/05/2008: EF 78%, 95% pLAD, 95% mLAD, 90% RV marginal --> 2 overlapping stents (unknown type) placed to the p-mLAD   DDD (degenerative disc disease), lumbar    Diastolic dysfunction 11/20/2009   a.) TTE 11/20/2009: EF >55%, mild LVH, mild LAE, triv AR/MR/TR, G1DD   Hyperlipidemia    Hypertension    Insomnia    Lumbar spondylolysis    Medial epicondylitis    Osteoarthritis    Poor balance    PSVT (paroxysmal supraventricular  tachycardia)    Urge incontinence     Surgical History: Past Surgical History:  Procedure Laterality Date   BACK SURGERY     CAROTID STENT     09/05/2008   CYSTOSCOPY WITH INSERTION OF UROLIFT N/A 11/01/2021   Procedure: CYSTOSCOPY WITH INSERTION OF UROLIFT;  Surgeon: Vanna Scotland, MD;  Location: [facility] ORS;  Service: Urology;  Laterality: N/A;    Home Medications:  Allergies as of 11/23/2022   No Active Allergies      Medication List        Accurate as of November 23, 2022 11:59 PM. If you have any questions, ask your nurse or doctor.          STOP taking these medications    acetaminophen 500 MG tablet Commonly known as: TYLENOL   Gemtesa 75 MG Tabs Generic drug: Vibegron   ibuprofen 800 MG tablet Commonly known as: ADVIL   levETIRAcetam 500 MG tablet Commonly known as: Keppra   PRESERVISION AREDS 2 PO       TAKE these medications    aspirin EC 81 MG tablet Take 81 mg by mouth daily.   atorvastatin 20  MG tablet Commonly known as: LIPITOR TAKE 1 TABLET DAILY   lisinopril 20 MG tablet Commonly known as: ZESTRIL Take 1 tablet (20 mg total) by mouth daily.   metoprolol succinate 25 MG 24 hr tablet Commonly known as: TOPROL-XL TAKE 1 TABLET DAILY   trospium 20 MG tablet Commonly known as: SANCTURA Take 1 tablet (20 mg total) by mouth 2 (two) times daily.        Allergies: No Active Allergies  Family History: No family history on file.  Social History:  reports that he has quit smoking. He has never used smokeless tobacco. He reports that he does not drink alcohol and does not use drugs.  ROS: Pertinent ROS in HPI  Physical Exam: BP (!) 144/80   Pulse 69   Ht 5\' 9"  (1.753 m)   Wt 160 lb (72.6 kg)   BMI 23.63 kg/m   Constitutional:  Well nourished. Alert and oriented, No acute distress. HEENT: [place] AT, moist mucus membranes.  Trachea midline Cardiovascular: No clubbing, cyanosis, or edema. Respiratory: Normal respiratory effort, no increased work of breathing. Neurologic: Grossly intact, no focal deficits, moving all 4 extremities. Psychiatric: Normal mood and affect.  Laboratory Data: Lab Results  Component Value Date   WBC 6.8 05/30/2022   HGB 14.3 05/30/2022   HCT 42.1 05/30/2022   MCV 93.1 05/30/2022   PLT 187 05/30/2022   Lab Results  Component Value Date   CREATININE 0.89 05/30/2022   Lab Results  Component Value Date   AST 34 05/30/2022   Lab Results  Component Value Date   ALT 18 05/30/2022   Urinalysis    Component Value Date/Time   COLORURINE YELLOW (A) 05/30/2022 0551   APPEARANCEUR CLEAR (A) 05/30/2022 0551   APPEARANCEUR Clear 10/20/2021 1441   LABSPEC 1.014 05/30/2022 0551   PHURINE 5.0 05/30/2022 0551   GLUCOSEU NEGATIVE 05/30/2022 0551   HGBUR NEGATIVE 05/30/2022 0551   BILIRUBINUR NEGATIVE 05/30/2022 0551   BILIRUBINUR Negative 10/20/2021 1441   KETONESUR NEGATIVE 05/30/2022 0551   PROTEINUR NEGATIVE 05/30/2022 0551   NITRITE  NEGATIVE 05/30/2022 0551   LEUKOCYTESUR NEGATIVE 05/30/2022 0551  I have reviewed the labs.   Pertinent Imaging:  11/23/22 14:39  Scan Result 13 mL    Assessment & Plan:    1. Urge incontinence -failed Gemtesa 75 mg  -  Discussed that we could try an anticholinergic as the symptoms are quite bothersome or have a trial of PTNS -explained the PTNS provides treatment by indirectly providing electrical stimulation to the nerves responsible for bladder and pelvic floor function - a needle electrode generates an adjustable electrical pulse that travels to the sacral plexus via the tibial nerve which is located in the ankle, among other functions, the sacral nerve plexus regulates bladder and pelvic floor function - treatment protocol requires once-a-week treatments for 12 weeks, 30 minutes per session and many patients begin to see improvements by the 6th treatment. Patients who respond to treatment may require occasional treatments (~ once every 3 weeks) to sustain improvements. PTNS is a low-risk procedure. The most common side-effects with PTNS treatment are temporary and minor, resulting from the placement of the needle electrode. They include minor bleeding, mild pain and skin inflammation and patients have seen up to an 80% success rate with this form of treatment -He would like to try the medication first, but I will go ahead and contact his insurance to see if it will cover the procedure and also gave him a brochure as he has family members who are physicians and he would like their opinion as well  2. Nocturia -likely secondary to OAB  3. BPH with LU TS -s/p UroLift  -PVR demonstrates adequate emptying   Return for Patient will call for follow up .  These notes generated with voice recognition software. I apologize for typographical errors.  Cloretta Ned  [facility] [address]  [address] [place], [place] [address] [phone]

## 2022-11-23 ENCOUNTER — Ambulatory Visit: Payer: Medicare HMO | Admitting: Urology

## 2022-11-23 VITALS — BP 144/80 | HR 69 | Ht 69.0 in | Wt 160.0 lb

## 2022-11-23 DIAGNOSIS — N401 Enlarged prostate with lower urinary tract symptoms: Secondary | ICD-10-CM | POA: Diagnosis not present

## 2022-11-23 DIAGNOSIS — R351 Nocturia: Secondary | ICD-10-CM | POA: Diagnosis not present

## 2022-11-23 DIAGNOSIS — N3941 Urge incontinence: Secondary | ICD-10-CM

## 2022-11-23 DIAGNOSIS — N138 Other obstructive and reflux uropathy: Secondary | ICD-10-CM

## 2022-11-23 LAB — BLADDER SCAN AMB NON-IMAGING: Scan Result: 13

## 2022-11-23 MED ORDER — TROSPIUM CHLORIDE 20 MG PO TABS: 20.0000 mg | ORAL_TABLET | Freq: Two times a day (BID) | ORAL | 3 refills | Status: DC

## 2022-11-25 ENCOUNTER — Telehealth: Payer: Self-pay

## 2022-11-25 NOTE — Telephone Encounter (Signed)
Received message from [employer] asking to check on patient's coverage for PTNS. NO PA is required, advised patient he would have a specialty co pay of $30 each time he comes in for PTNS x 12 weeks. Patient advised, patient will wait to proceed with this. FYI to [employer]

## 2022-11-28 ENCOUNTER — Encounter: Payer: Self-pay | Admitting: Urology

## 2022-12-07 ENCOUNTER — Ambulatory Visit: Payer: Medicare HMO | Admitting: Urology

## 2022-12-07 VITALS — BP 179/81 | HR 64 | Ht 69.0 in | Wt 165.4 lb

## 2022-12-07 DIAGNOSIS — K5909 Other constipation: Secondary | ICD-10-CM

## 2022-12-07 DIAGNOSIS — N3941 Urge incontinence: Secondary | ICD-10-CM | POA: Diagnosis not present

## 2022-12-07 DIAGNOSIS — R399 Unspecified symptoms and signs involving the genitourinary system: Secondary | ICD-10-CM | POA: Diagnosis not present

## 2022-12-07 DIAGNOSIS — R339 Retention of urine, unspecified: Secondary | ICD-10-CM

## 2022-12-07 LAB — URINALYSIS, COMPLETE
Bilirubin, UA: NEGATIVE
Glucose, UA: NEGATIVE
Ketones, UA: NEGATIVE
Leukocytes,UA: NEGATIVE
Nitrite, UA: NEGATIVE
Protein,UA: NEGATIVE
Specific Gravity, UA: 1.02 (ref 1.005–1.030)
Urobilinogen, Ur: 0.2 mg/dL (ref 0.2–1.0)
pH, UA: 6 (ref 5.0–7.5)

## 2022-12-07 LAB — MICROSCOPIC EXAMINATION

## 2022-12-07 LAB — BLADDER SCAN AMB NON-IMAGING: PVR: 702 WU

## 2022-12-07 NOTE — Progress Notes (Signed)
Marcelle Overlie Plume,acting as a scribe for Vanna Scotland, MD.,have documented all relevant documentation on the behalf of Vanna Scotland, MD,as directed by  Vanna Scotland, MD while in the presence of Vanna Scotland, MD.  12/07/2022 2:31 PM   [name] Patrici Ranks Needle 1930-12-27 [id number]  Referring provider: [facility] [address] [place],  [place] [address]  Chief Complaint  Patient presents with   Follow-up    HPI: 89 year-old male who presents today for further evaluation of urinary symptoms. He was initially seen and evaluated a year and a half ago with urge incontinence, weak stream, and poorly controlled urinary symptoms. He tried and failed myrbetriq and Flomax.   On his initial presentation, he was interested in UroLift. His prostate was not particularly enlarged, but he did have a trabeculated bladder. It was unclear whether or not his urinary symptoms were related to underlying BPH, so he did undergo an uncomplicated UroLift. Postoperatively, he reported improvement in his urinary stream but continued to have significant urgency and urge incontinence. At that point, he tried [place], which he found unhelpful.   More recently, he saw [name], who started him on Trospium. He sent a MyChart message last week indicating that after taking the medication, he had painful urges to urinate but was unable to urinate and began leaking continuously. He was advised to stop the Trospium, which he did immediately, but continued to have issues.  He only took 4 tablets and then stopped immediately when he had trouble urinating and leaking urine.  His urinalysis today has 3-10 RBC per high powered field, otherwise negative.   Today, he reports urgency, incontinence, and painful urges to urinate without being able to. He also reports constipation, with no bowel movement for 6 days until today after taking Miralax.   Results for orders placed or performed in visit on 12/07/22   Bladder Scan (Post Void Residual) in office  Result Value Ref Range   PVR 702.0 WU     PMH: Past Medical History:  Diagnosis Date   Allergic rhinitis due to pollen    Arthritis    BPH (benign prostatic hyperplasia)    Chronic back pain    Coronary artery disease 09/05/2008   a.) LHC/PCI 09/05/2008: EF 78%, 95% pLAD, 95% mLAD, 90% RV marginal --> 2 overlapping stents (unknown type) placed to the p-mLAD   DDD (degenerative disc disease), lumbar    Diastolic dysfunction 11/20/2009   a.) TTE 11/20/2009: EF >55%, mild LVH, mild LAE, triv AR/MR/TR, G1DD   Hyperlipidemia    Hypertension    Insomnia    Lumbar spondylolysis    Medial epicondylitis    Osteoarthritis    Poor balance    PSVT (paroxysmal supraventricular tachycardia)    Urge incontinence     Surgical History: Past Surgical History:  Procedure Laterality Date   BACK SURGERY     CAROTID STENT     09/05/2008   CYSTOSCOPY WITH INSERTION OF UROLIFT N/A 11/01/2021   Procedure: CYSTOSCOPY WITH INSERTION OF UROLIFT;  Surgeon: Vanna Scotland, MD;  Location: [facility] ORS;  Service: Urology;  Laterality: N/A;    Home Medications:  Allergies as of 12/07/2022   No Active Allergies      Medication List        Accurate as of December 07, 2022  2:31 PM. If you have any questions, ask your nurse or doctor.          aspirin EC 81 MG tablet Take 81 mg  by mouth daily.   atorvastatin 20 MG tablet Commonly known as: LIPITOR TAKE 1 TABLET DAILY   lisinopril 20 MG tablet Commonly known as: ZESTRIL Take 1 tablet (20 mg total) by mouth daily.   metoprolol succinate 25 MG 24 hr tablet Commonly known as: TOPROL-XL TAKE 1 TABLET DAILY   trospium 20 MG tablet Commonly known as: SANCTURA Take 1 tablet (20 mg total) by mouth 2 (two) times daily.         Social History:  reports that he has quit smoking. He has never used smokeless tobacco. He reports that he does not drink alcohol and does not use drugs.   Physical  Exam: BP (!) 179/81   Pulse 64   Ht 5\' 9"  (1.753 m)   Wt 165 lb 6.4 oz (75 kg)   BMI 24.43 kg/m   Constitutional:  Alert and oriented, No acute distress. HEENT:  AT, moist mucus membranes.  Trachea midline, no masses. Neurologic: Grossly intact, no focal deficits, moving all 4 extremities. Psychiatric: Normal mood and affect.  Results for orders placed or performed in visit on 12/07/22  Microscopic Examination   Urine  Result Value Ref Range   WBC, UA 0-5 0 - 5 /hpf   RBC, Urine 3-10 (A) 0 - 2 /hpf   Epithelial Cells (non renal) 0-10 0 - 10 /hpf   Casts Present (A) None seen /lpf   Cast Type Hyaline casts N/A   Mucus, UA Present (A) Not Estab.   Bacteria, UA Few None seen/Few  Urinalysis, Complete  Result Value Ref Range   Specific Gravity, UA 1.020 1.005 - 1.030   pH, UA 6.0 5.0 - 7.5   Color, UA Yellow Yellow   Appearance Ur Hazy (A) Clear   Leukocytes,UA Negative Negative   Protein,UA Negative Negative/Trace   Glucose, UA Negative Negative   Ketones, UA Negative Negative   RBC, UA Trace (A) Negative   Bilirubin, UA Negative Negative   Urobilinogen, Ur 0.2 0.2 - 1.0 mg/dL   Nitrite, UA Negative Negative   Microscopic Examination See below:   Bladder Scan (Post Void Residual) in office  Result Value Ref Range   PVR 702.0 WU    Assessment & Plan:    1. Urinary retention - PVR today is 702 mL - Frank urinary retention with overflow incontinence. - Place a Foley catheter today and leave it in place for a week. Schedule a voiding trial next week. - Anticholinergics are contraindicated due to the profound response.  2. Constipation - Likely triggered by anticholinergic medication and exacerbating #1 - He has had only 1 small bowel movement in 5-6 days. - Wrote down some options including Fleets enema, and magnesium citrate if he fails to respond to Miralax.  - Discussed importance of managing constipation to avoid further urinary retention  3. Severe OAB -  Failed multiple treatment options including Flomax, anticholinergics, and Gemtesa and addressing his outlet - Concern that Botox would also lead to retention. - Strongly consider PTNS. He has elected not to proceed with this until the exact cost is known.  Return in about 1 week (around 12/14/2022) for voiding trial.  I spent 35 total minutes on the day of the encounter including pre-visit review of the medical record, face-to-face time with the patient, and post visit ordering of labs/imaging/tests.    [facility] [address] [place], [place] [address] [phone]

## 2022-12-07 NOTE — Progress Notes (Signed)
Simple Catheter Placement  Due to urinary retention patient is present today for a foley cath placement.  Patient was cleaned and prepped in a sterile fashion with betadine and 2% lidocaine jelly was instilled into the urethra. A 16 FR coude foley catheter was inserted, urine return was noted  1100 ml, urine was dark brown/yellow in color.  The balloon was filled with 10cc of sterile water.  A leg bag was attached for drainage. Patient was also given a night bag to take home and was given instruction on how to change from one bag to another.  Patient was given instruction on proper catheter care.  Patient tolerated well, no complications were noted   Performed by: Jos Cygan H RMA  Additional notes/ Follow up: Return in about 1 week (around 12/14/2022) for voiding trial.

## 2022-12-11 ENCOUNTER — Encounter: Payer: Self-pay | Admitting: Urology

## 2022-12-12 NOTE — Telephone Encounter (Signed)
Noted  

## 2022-12-12 NOTE — Telephone Encounter (Signed)
Patient called back and stated that "tube that has white part" keeps coming out. He said that urine is going in catheter bag. He said he is urinating well, and asked about taking catheter out. I advised him to speak with medical assistant.

## 2022-12-14 ENCOUNTER — Ambulatory Visit: Payer: Medicare HMO | Admitting: Urology

## 2022-12-14 ENCOUNTER — Ambulatory Visit: Payer: Medicare HMO | Admitting: Physician Assistant

## 2022-12-14 DIAGNOSIS — R339 Retention of urine, unspecified: Secondary | ICD-10-CM | POA: Diagnosis not present

## 2022-12-14 LAB — BLADDER SCAN AMB NON-IMAGING: PVR: 496 WU

## 2022-12-14 NOTE — Progress Notes (Signed)
Catheter Removal  Patient is present today for a catheter removal.  9 ml of water was drained from the balloon. A 16 FR foley cath was removed from the bladder, no complications were noted. Patient tolerated well.  Performed by: Jazzmon Prindle H RMA  Follow up/ Additional notes: in the pm for PVR

## 2022-12-14 NOTE — Progress Notes (Signed)
12/14/2022 6:21 PM   Dennis Bond 1930/04/25 [id number]  CC: Chief Complaint  Patient presents with   voiding trial   HPI: Dennis Bond is a 89 y.o. male with PMH LUTS s/p UroLift who recently developed urinary retention in the setting of constipation and trospium use who presents today for voiding trial with his wife, Dennis Bond.    Foley catheter removed in the morning, see separate procedure note for details.  He returned to clinic in the afternoon and reports only able to urinate in small amounts.  PVR >434mL.   He only has a mild suprapubic discomfort.  He has not been aggressive concerning his bowel regimen and only has been having small BM's.    Patient denies any modifying or aggravating factors.  Patient denies any recent UTI's, gross hematuria, dysuria or suprapubic/flank pain.  Patient denies any fevers, chills, nausea or vomiting.    PMH: Past Medical History:  Diagnosis Date   Allergic rhinitis due to pollen    Arthritis    BPH (benign prostatic hyperplasia)    Chronic back pain    Coronary artery disease 09/05/2008   a.) LHC/PCI 09/05/2008: EF 78%, 95% pLAD, 95% mLAD, 90% RV marginal --> 2 overlapping stents (unknown type) placed to the p-mLAD   DDD (degenerative disc disease), lumbar    Diastolic dysfunction 11/20/2009   a.) TTE 11/20/2009: EF >55%, mild LVH, mild LAE, triv AR/MR/TR, G1DD   Hyperlipidemia    Hypertension    Insomnia    Lumbar spondylolysis    Medial epicondylitis    Osteoarthritis    Poor balance    PSVT (paroxysmal supraventricular tachycardia)    Urge incontinence     Surgical History: Past Surgical History:  Procedure Laterality Date   BACK SURGERY     CAROTID STENT     09/05/2008   CYSTOSCOPY WITH INSERTION OF UROLIFT N/A 11/01/2021   Procedure: CYSTOSCOPY WITH INSERTION OF UROLIFT;  Surgeon: Vanna Scotland, MD;  Location: [facility] ORS;  Service: Urology;  Laterality: N/A;    Home Medications:  Allergies as of 12/14/2022   No Active  Allergies      Medication List        Accurate as of December 14, 2022 11:59 PM. If you have any questions, ask your nurse or doctor.          STOP taking these medications    trospium 20 MG tablet Commonly known as: SANCTURA       TAKE these medications    aspirin EC 81 MG tablet Take 81 mg by mouth daily.   atorvastatin 20 MG tablet Commonly known as: LIPITOR TAKE 1 TABLET DAILY   lisinopril 20 MG tablet Commonly known as: ZESTRIL Take 1 tablet (20 mg total) by mouth daily.   metoprolol succinate 25 MG 24 hr tablet Commonly known as: TOPROL-XL TAKE 1 TABLET DAILY        Allergies:  No Active Allergies  Family History: No family history on file.  Social History:   reports that he has quit smoking. He has never used smokeless tobacco. He reports that he does not drink alcohol and does not use drugs.  Physical Exam: There were no vitals taken for this visit.  Constitutional:  Alert and oriented, no acute distress, nontoxic appearing HEENT: [place], AT Cardiovascular: No clubbing, cyanosis, or edema Respiratory: Normal respiratory effort, no increased work of breathing GI: Abdomen is soft, nontender, nondistended, no abdominal masses GU: No CVA tenderness Lymph: No cervical or  inguinal lymphadenopathy Skin: No rashes, bruises or suspicious lesions Neurologic: Grossly intact, no focal deficits, moving all 4 extremities Psychiatric: Normal mood and affect  Laboratory Data: Lab Results  Component Value Date   WBC 6.8 05/30/2022   HGB 14.3 05/30/2022   HCT 42.1 05/30/2022   MCV 93.1 05/30/2022   PLT 187 05/30/2022    Lab Results  Component Value Date   CREATININE 0.89 05/30/2022  I have reviewed the labs.  See HPI.     Pertinent Imaging: Results for orders placed or performed in visit on 12/14/22  BLADDER SCAN AMB NON-IMAGING  Result Value Ref Range   PVR 496.0 WU      Simple Catheter Placement  Due to urinary retention patient is  present today for a foley cath placement.  Patient was cleaned and prepped in a sterile fashion with betadine and 2% lidocaine jelly was instilled into the urethra. A 18 fr Coude foley catheter was inserted, urine return was noted  500 cc ml, urine was yellow in color.  The balloon was filled with 10cc of sterile water.  A leg bag was attached for drainage. Patient was also given a night bag to take home and was given instruction on how to change from one bag to another.  Patient was given instruction on proper catheter care.  Patient tolerated well, no complications were noted   Assessment & Plan:    1. Urinary retention -After discussion with the patient, his wife and his son, Dr. Sabino Bond, he agreed to have another Foley catheter placed -He will use a fleets enema tonight and start taking MiraLAX daily to facilitate more bowel movements -will have a repeat voiding trial next week    Return in about 1 week (around 12/21/2022) for TOV/PVR.  [name], PA-C  [facility] [address], [address] [place], [place] [address] [phone]

## 2022-12-14 NOTE — Patient Instructions (Signed)
Start taking MiraLax daily.  Use a Fleets Enema today.

## 2022-12-18 ENCOUNTER — Encounter: Payer: Self-pay | Admitting: Urology

## 2022-12-20 NOTE — Progress Notes (Deleted)
12/21/2022 11:58 AM   [name] [name] [birth year] [id number]  Urological history: 1. Urge incontinence -contributing factors of age, HTN, lumbar disc disease, arthritis and BPH.   2. BPH with LU TS -cysto (07/2021) - moderate to severe trabeculation -TRUS (07/2021) - 38 cc -UroLift (10/2021) - 6 implants   CC: No chief complaint on file.  HPI: Dennis Bond is a 89 y.o. male  who presents today for TOV.  Foley removed this am.    PVR ***    PMH: Past Medical History:  Diagnosis Date   Allergic rhinitis due to pollen    Arthritis    BPH (benign prostatic hyperplasia)    Chronic back pain    Coronary artery disease 09/05/2008   a.) LHC/PCI 09/05/2008: EF 78%, 95% pLAD, 95% mLAD, 90% RV marginal --> 2 overlapping stents (unknown type) placed to the p-mLAD   DDD (degenerative disc disease), lumbar    Diastolic dysfunction 11/20/2009   a.) TTE 11/20/2009: EF >55%, mild LVH, mild LAE, triv AR/MR/TR, G1DD   Hyperlipidemia    Hypertension    Insomnia    Lumbar spondylolysis    Medial epicondylitis    Osteoarthritis    Poor balance    PSVT (paroxysmal supraventricular tachycardia)    Urge incontinence     Surgical History: Past Surgical History:  Procedure Laterality Date   BACK SURGERY     CAROTID STENT     09/05/2008   CYSTOSCOPY WITH INSERTION OF UROLIFT N/A 11/01/2021   Procedure: CYSTOSCOPY WITH INSERTION OF UROLIFT;  Surgeon: Vanna Scotland, MD;  Location: [facility] ORS;  Service: Urology;  Laterality: N/A;    Home Medications:  Allergies as of 12/21/2022   No Active Allergies      Medication List        Accurate as of December 20, 2022 11:58 AM. If you have any questions, ask your nurse or doctor.          aspirin EC 81 MG tablet Take 81 mg by mouth daily.   atorvastatin 20 MG tablet Commonly known as: LIPITOR TAKE 1 TABLET DAILY   lisinopril 20 MG tablet Commonly known as: ZESTRIL Take 1 tablet (20 mg total) by mouth daily.   metoprolol  succinate 25 MG 24 hr tablet Commonly known as: TOPROL-XL TAKE 1 TABLET DAILY        Allergies:  No Active Allergies  Family History: No family history on file.  Social History:   reports that he has quit smoking. He has never used smokeless tobacco. He reports that he does not drink alcohol and does not use drugs.  Physical Exam: There were no vitals taken for this visit.  Constitutional:  Well nourished. Alert and oriented, No acute distress. HEENT: [place] AT, moist mucus membranes.  Trachea midline, no masses. Cardiovascular: No clubbing, cyanosis, or edema. Respiratory: Normal respiratory effort, no increased work of breathing. GI: Abdomen is soft, non tender, non distended, no abdominal masses. Liver and spleen not palpable.  No hernias appreciated.  Stool sample for occult testing is not indicated.   GU: No CVA tenderness.  No bladder fullness or masses.  Patient with circumcised/uncircumcised phallus. ***Foreskin easily retracted***  Urethral meatus is patent.  No penile discharge. No penile lesions or rashes. Scrotum without lesions, cysts, rashes and/or edema.  Testicles are located scrotally bilaterally. No masses are appreciated in the testicles. Left and right epididymis are normal. Rectal: Patient with  normal sphincter tone. Anus and perineum without scarring or rashes. No  rectal masses are appreciated. Prostate is approximately *** grams, *** nodules are appreciated. Seminal vesicles are normal. Skin: No rashes, bruises or suspicious lesions. Lymph: No cervical or inguinal adenopathy. Neurologic: Grossly intact, no focal deficits, moving all 4 extremities. Psychiatric: Normal mood and affect.   Laboratory Data: N/A  Pertinent Imaging: ***      Assessment & Plan:    1. Urinary retention -After discussion with the patient, his wife and his son, Dr. Sabino Snipes, he agreed to have another Foley catheter placed -He will use a fleets enema tonight and start taking  MiraLAX daily to facilitate more bowel movements -will have a repeat voiding trial next week    No follow-ups on file.  [name], PA-C  [facility] Urology [facility] [address], [address] [place], [place] [address] [phone]

## 2022-12-21 ENCOUNTER — Ambulatory Visit: Payer: Medicare HMO | Admitting: Urology

## 2022-12-21 ENCOUNTER — Encounter: Payer: Self-pay | Admitting: Urology

## 2022-12-21 VITALS — Ht 69.0 in | Wt 165.0 lb

## 2022-12-21 DIAGNOSIS — R339 Retention of urine, unspecified: Secondary | ICD-10-CM

## 2022-12-21 NOTE — Progress Notes (Signed)
12/21/2022 4:41 PM   [employer] [place] [birth year] [id number]  Urological history: 1. Urge incontinence -contributing factors of age, HTN, lumbar disc disease, arthritis and BPH.   2. BPH with LU TS -cysto (07/2021) - moderate to severe trabeculation -TRUS (07/2021) - 38 cc -UroLift (10/2021) - 6 implants   CC: Chief Complaint  Patient presents with   Follow-up   HPI: Dennis Bond is a 89 y.o. male  who presents today for TOV.  Foley removed this am.   He pushed fluids throughout the day, but he has been unable to void.   Foley replaced this afternoon for a return of 800 cc of clear yellow urine.    PMH: Past Medical History:  Diagnosis Date   Allergic rhinitis due to pollen    Arthritis    BPH (benign prostatic hyperplasia)    Chronic back pain    Coronary artery disease 09/05/2008   a.) LHC/PCI 09/05/2008: EF 78%, 95% pLAD, 95% mLAD, 90% RV marginal --> 2 overlapping stents (unknown type) placed to the p-mLAD   DDD (degenerative disc disease), lumbar    Diastolic dysfunction 11/20/2009   a.) TTE 11/20/2009: EF >55%, mild LVH, mild LAE, triv AR/MR/TR, G1DD   Hyperlipidemia    Hypertension    Insomnia    Lumbar spondylolysis    Medial epicondylitis    Osteoarthritis    Poor balance    PSVT (paroxysmal supraventricular tachycardia)    Urge incontinence     Surgical History: Past Surgical History:  Procedure Laterality Date   BACK SURGERY     CAROTID STENT     09/05/2008   CYSTOSCOPY WITH INSERTION OF UROLIFT N/A 11/01/2021   Procedure: CYSTOSCOPY WITH INSERTION OF UROLIFT;  Surgeon: Vanna Scotland, MD;  Location: [facility] ORS;  Service: Urology;  Laterality: N/A;    Home Medications:  Allergies as of 12/21/2022   No Active Allergies      Medication List        Accurate as of December 21, 2022  4:41 PM. If you have any questions, ask your nurse or doctor.          aspirin EC 81 MG tablet Take 81 mg by mouth daily.   atorvastatin 20 MG  tablet Commonly known as: LIPITOR TAKE 1 TABLET DAILY   lisinopril 20 MG tablet Commonly known as: ZESTRIL Take 1 tablet (20 mg total) by mouth daily.   metoprolol succinate 25 MG 24 hr tablet Commonly known as: TOPROL-XL TAKE 1 TABLET DAILY        Allergies:  No Active Allergies  Family History: No family history on file.  Social History:   reports that he has quit smoking. He has never used smokeless tobacco. He reports that he does not drink alcohol and does not use drugs.  Physical Exam: Ht 5\' 9"  (1.753 m)   Wt 165 lb (74.8 kg)   BMI 24.37 kg/m   Constitutional:  Well nourished. Alert and oriented, No acute distress. HEENT:  AT, moist mucus membranes.  Trachea midline Cardiovascular: No clubbing, cyanosis, or edema. Respiratory: Normal respiratory effort, no increased work of breathing. Neurologic: Grossly intact, no focal deficits, moving all 4 extremities. Psychiatric: Normal mood and affect.   Laboratory Data: N/A  Pertinent Imaging: N/A  Assessment & Plan:    1. Urinary retention -Failed voiding trial x 2 -will go ahead and schedule repeat cysto to evaluate for BOO and UroLift integrity  -Reviewed how cystoscopy is performed and what to expect afterwards -  His questions are answered and he wishes to proceed  Return for cysto for retention .  [name], PA-C  [facility]rology [facility] [address], [address] [place], [place] [address] [phone]

## 2022-12-21 NOTE — Progress Notes (Signed)
Catheter Removal  Patient is present today for a catheter removal.  10ml of water was drained from the balloon. A 18FR foley cath was removed from the bladder, no complications were noted. Patient tolerated well.  Performed by: Randa Lynn, RMA  Follow up/ Additional notes: this afternoon for PM voiding trial

## 2022-12-21 NOTE — Progress Notes (Signed)
Simple Catheter Placement  Due to urinary retention patient is present today for a foley cath placement.  Patient was cleaned and prepped in a sterile fashion with betadine . A 18 FR foley coude catheter was inserted, urine return was noted  800 ml, urine was clear and yellow  in color.  The balloon was filled with 10cc of sterile water.  A leg bag was attached for drainage. Patient was also given a night bag to take home and was given instruction on how to change from one bag to another.  Patient was given instruction on proper catheter care.  Patient tolerated well, no complications were noted   Performed by: Randa Lynn, RMA  Additional notes/ Follow up: No follow-ups on file.

## 2023-01-06 ENCOUNTER — Ambulatory Visit: Payer: Medicare HMO | Admitting: Physician Assistant

## 2023-01-06 ENCOUNTER — Ambulatory Visit: Payer: Medicare HMO | Admitting: Urology

## 2023-01-06 VITALS — BP 170/80 | HR 60

## 2023-01-06 DIAGNOSIS — R339 Retention of urine, unspecified: Secondary | ICD-10-CM | POA: Diagnosis not present

## 2023-01-06 NOTE — Patient Instructions (Signed)
Overactive Bladder, Adult  Overactive bladder is a condition in which a person has a sudden and frequent need to urinate. A person might also leak urine if he or she cannot get to the bathroom fast enough (urinary incontinence). Sometimes, symptoms can interfere with work or social activities. What are the causes? Overactive bladder is associated with poor nerve signals between your bladder and your brain. Your bladder may get the signal to empty before it is full. You may also have very sensitive muscles that make your bladder squeeze too soon. This condition may also be caused by other factors, such as: Medical conditions: Urinary tract infection. Infection of nearby tissues. Prostate enlargement. Bladder stones, inflammation, or tumors. Diabetes. Muscle or nerve weakness, especially from these conditions: A spinal cord injury. Stroke. Multiple sclerosis. Parkinson's disease. Other causes: Surgery on the uterus or urethra. Drinking too much caffeine or alcohol. Certain medicines, especially those that eliminate extra fluid in the body (diuretics). Constipation. What increases the risk? You may be at greater risk for overactive bladder if you: Are an older adult. Smoke. Are going through menopause. Have prostate problems. Have a neurological disease, such as stroke, dementia, Parkinson's disease, or multiple sclerosis (MS). Eat or drink alcohol, spicy food, caffeine, and other things that irritate the bladder. Are overweight or obese. What are the signs or symptoms? Symptoms of this condition include a sudden, strong urge to urinate. Other symptoms include: Leaking urine. Urinating 8 or more times a day. Waking up to urinate 2 or more times overnight. How is this diagnosed? This condition may be diagnosed based on: Your symptoms and medical history. A physical exam. Blood or urine tests to check for possible causes, such as infection. You may also need to see a health care  provider who specializes in urinary tract problems. This is called a urologist. How is this treated? Treatment for overactive bladder depends on the cause of your condition and whether it is mild or severe. Treatment may include: Bladder training, such as: Learning to control the urge to urinate by following a schedule to urinate at regular intervals. Doing Kegel exercises to strengthen the pelvic floor muscles that support your bladder. Special devices, such as: Biofeedback. This uses sensors to help you become aware of your body's signals. Electrical stimulation. This uses electrodes placed inside the body (implanted) or outside the body. These electrodes send gentle pulses of electricity to strengthen the nerves or muscles that control the bladder. Women may use a plastic device, called a pessary, that fits into the vagina and supports the bladder. Medicines, such as: Antibiotics to treat bladder infection. Antispasmodics to stop the bladder from releasing urine at the wrong time. Tricyclic antidepressants to relax bladder muscles. Injections of botulinum toxin type A directly into the bladder tissue to relax bladder muscles. Surgery, such as: A device may be implanted to help manage the nerve signals that control urination. An electrode may be implanted to stimulate electrical signals in the bladder. A procedure may be done to change the shape of the bladder. This is done only in very severe cases. Follow these instructions at home: Eating and drinking  Make diet or lifestyle changes recommended by your health care provider. These may include: Drinking fluids throughout the day and not only with meals. Cutting down on caffeine or alcohol. Eating a healthy and balanced diet to prevent constipation. This may include: Choosing foods that are high in fiber, such as beans, whole grains, and fresh fruits and vegetables. Limiting foods that  are high in fat and processed sugars, such as fried  and sweet foods. Lifestyle  Lose weight if needed. Do not use any products that contain nicotine or tobacco. These include cigarettes, chewing tobacco, and vaping devices, such as e-cigarettes. If you need help quitting, ask your health care provider. General instructions Take over-the-counter and prescription medicines only as told by your health care provider. If you were prescribed an antibiotic medicine, take it as told by your health care provider. Do not stop taking the antibiotic even if you start to feel better. Use any implants or pessary as told by your health care provider. If needed, wear pads to absorb urine leakage. Keep a log to track how much and when you drink, and when you need to urinate. This will help your health care provider monitor your condition. Keep all follow-up visits. This is important. Contact a health care provider if: You have a fever or chills. Your symptoms do not get better with treatment. Your pain and discomfort get worse. You have more frequent urges to urinate. Get help right away if: You are not able to control your bladder. Summary Overactive bladder refers to a condition in which a person has a sudden and frequent need to urinate. Several conditions may lead to an overactive bladder. Treatment for overactive bladder depends on the cause and severity of your condition. Making lifestyle changes, doing Kegel exercises, keeping a log, and taking medicines can help with this condition. This information is not intended to replace advice given to you by your health care provider. Make sure you discuss any questions you have with your health care provider. Document Revised: 12/02/2019 Document Reviewed: 12/02/2019 Elsevier Patient Education  2024 [employer].     Step 1 Get all of your supplies ready and place them near you. Step 2 Wash your hands with warm, soapy water or put on gloves. Step 3 Wash around the tip of your penis with warm, soapy  water or a castille soap towelette. Step 4 Take catheter out of package and drain the lubricant over toilet. Step 5 Hold the penis at a 45 degree angle from the stomach in one hand and the catheter in the other hand. Step 6 Insert the catheter slowly into your urethra. If there is resistance when the catheter reaches the sphincter muscle,              take a deep breath and gently apply steady pressure.              DO NOT FORCE THE CATHETER Step 7 When the urine begins to flow, insert another inch and lower penis. Allow the urine to flow into the toilet. Step 8 When the flow of urine stops, slowly remove the catheter. Transurethral Resection of the Prostate Transurethral resection of the prostate (TURP) is the removal, or resection, of part of the prostate tissue. This procedure is done to treat an enlarged prostate gland (benign prostatic hyperplasia). The goal of TURP is to remove enough prostate tissue to allow for a normal flow of urine. The procedure will allow you to empty your bladder more completely when you urinate so that you can urinate less often. In a transurethral resection, a thin telescope with a light, a camera, and an electric cutting edge (resectoscope) is passed through the urethra and into the prostate. The opening of the urethra is at the end of the penis. Tell a health care provider about: Any allergies you have. All medicines you are  taking, including vitamins, herbs, eye drops, creams, and over-the-counter medicines. Any problems you or family members have had with anesthetic medicines. Any bleeding problems you have. Any surgeries you have had. Any medical conditions you have. Any prostate infections you have had. What are the risks? Generally, this is a safe procedure. However, problems may occur, including: Infection. Bleeding. Allergic reactions to medicines. Blood in the urine (hematuria). Damage to nearby structures or organs. Other problems may occur, but  they are rare. They include: Dry ejaculation, or having no semen come out during orgasm. Erectile dysfunction, or being unable to have or keep an erection. Scarring that leads to narrowing of the urethra. This narrowing may block the flow of urine. Inability to control when you urinate (incontinence). Deep vein thrombosis. This is a blood clot that can develop in your leg. TURP syndrome. This can happen when you lose too much sodium during or after the procedure. Some signs and symptoms of this condition include: Weakness. Headaches. Nausea or vomiting. Muscle cramping. What happens before the procedure? When to stop eating and drinking Follow instructions from your health care provider about what you may eat and drink before your procedure. These may include: 8 hours before your procedure Stop eating most foods. Do not eat meat, fried foods, or fatty foods. Eat only light foods, such as toast or crackers. All liquids are okay except energy drinks and alcohol. 6 hours before your procedure Stop eating. Drink only clear liquids, such as water, clear fruit juice, black coffee, plain tea, and sports drinks. Do not drink energy drinks or alcohol. 2 hours before your procedure Stop drinking all liquids. You may be allowed to take medicines with small sips of water. If you do not follow your health care provider's instructions, your procedure may be delayed or canceled. Medicines Ask your health care provider about: Changing or stopping your regular medicines. This is especially important if you are taking diabetes medicines or blood thinners. Taking medicines such as aspirin and ibuprofen. These medicines can thin your blood. Do not take these medicines unless your health care provider tells you to take them. Taking over-the-counter medicines, vitamins, herbs, and supplements. Surgery safety Ask your health care provider what steps will be taken to help prevent infection. These steps may  include: Removing hair at the surgery site. Washing skin with a germ-killing soap. Taking antibiotic medicine. General instructions Do not use any products that contain nicotine or tobacco for at least 4 weeks before the procedure. These products include cigarettes, chewing tobacco, and vaping devices, such as e-cigarettes. If you need help quitting, ask your health care provider. If you will be going home right after the procedure, plan to have a responsible adult: Take you home from the hospital or clinic. You will not be allowed to drive. Care for you for the time you are told. What happens during the procedure?  An IV will be inserted into one of your veins. You will be given one or more of the following: A medicine to help you relax (sedative). A medicine to make you fall asleep (general anesthetic). A medicine that is injected into your spine to numb the area below and slightly above the injection site (spinal anesthetic). Your legs will be placed in foot rests (stirrups) so that your legs are apart and your knees are bent. The resectoscope will be passed through your urethra to your prostate. Parts of your prostate will be resected using the cutting edge of the resectoscope. Fluid will be  passed to rinse out the cut tissues (irrigation). The resectoscope will be removed. A small, thin tube (catheter) will be passed through your urethra and into your bladder. The catheter will drain urine into a bag outside of your body. The procedure may vary among health care providers and hospitals. What happens after the procedure? Your blood pressure, heart rate, breathing rate, and blood oxygen level will be monitored until you leave the hospital or clinic. You will be given fluids through the IV. The IV will be removed when you start eating and drinking normally. You may have some pain. Pain medicine will be available to help you. You will have a catheter draining your urine. You may have  blood in your urine. Your catheter may be kept in until your urine is clear. Your urinary drainage will be monitored. If necessary, your bladder may be rinsed out (irrigated) through your catheter. You will be encouraged to walk around as soon as possible. You may have to wear compression stockings. These stockings help to prevent blood clots and reduce swelling in your legs. If you were given a sedative during the procedure, it can affect you for several hours. Do not drive or operate machinery until your health care provider says that it is safe. Summary Transurethral resection of the prostate (TURP) is the removal (resection) of part of the prostate tissue. The goal of this procedure is to remove enough prostate tissue to allow for a normal flow of urine. Follow instructions from your health care provider about taking medicines and about eating and drinking before the procedure. This information is not intended to replace advice given to you by your health care provider. Make sure you discuss any questions you have with your health care provider. Document Revised: 12/08/2020 Document Reviewed: 12/08/2020 Elsevier Patient Education  2024 [employer].

## 2023-01-06 NOTE — Progress Notes (Signed)
01/06/23  CC:  Chief Complaint  Patient presents with   Cysto    HPI: 89 year old male who presents today for cystoscopic evaluation.  He has refractory urinary retention and is failed several voiding trials.  Please see previous notes for details.  Blood pressure (!) 170/80, pulse 60. NED. A&Ox3.   No respiratory distress   Abd soft, NT, ND Normal phallus with bilateral descended testicles  Cystoscopy Procedure Note  Patient identification was confirmed, informed consent was obtained, and patient was prepped using Betadine solution.  Lidocaine jelly was administered per urethral meatus.     Pre-Procedure: - Inspection reveals a normal caliber ureteral meatus.  Procedure: The flexible cystoscope was introduced without difficulty - No urethral strictures/lesions are present. -Relatively short prostatic length, wide open anterior channel on the right with some billowing tissue near the left base laterally.  UroLift clips seen within the prostatic fossa with slight indention at each location. - Normal bladder neck - Bilateral ureteral orifices identified - Bladder mucosa  reveals some mild catheter cystitis in the posterior bladder wall - No bladder stones -Moderate to severe trabeculation  Retroflexion shows 1 isolated UroLift tine adjacent to the bladder neck without overlying calcification   Post-Procedure: - Patient tolerated the procedure well  Assessment/ Plan:  1. Urinary retention Cystoscopy today does show some billowy tissue at the left bladder neck -possibly suboptimal UroLift effect at this 1 particular location although it is unclear whether or not this is the cause of his ongoing urinary retention  We had a lengthy shared decision-making conversation today with his wife and daughter who was present.  We discussed various options moving forward which include maintaining his Foley catheter, learning self cath, urodynamics diagnostic evaluation versus going  ahead and proceeding with an alternative outlet procedure such as TURP.  We discussed the pathophysiology of longstanding urinary obstruction.  The patient's daughter mentions that he is unable to sleep through the night and his overall quality of life is improved with the Foley catheter.  We did discuss risk and benefits in detail about continuing chronic indwelling Foley versus SP tube particularly the risk of infection amongst others.  Alternatively, could consider self cath which has a slightly lower risk of infection would allow him to empty his bladder.  That being said, urinary frequency is one of his baseline issues and this may or may not help with this particular problem.  The third option is to proceed with urodynamics.  This is a diagnostic test and I discussed the details extensively along with providing literature.  When the patient is in urinary retention however, this we discussed that the pressure flow study sometimes is nondiagnostic and this may or may not help [place] but is an option to try to give [place] more information about his overall bladder health and the role of ongoing obstruction.  Lastly, we discussed going ahead and proceeding with an outlet procedure such as TURP.  Overall, his prostate is relatively small and he would be a good candidate for this.  Redo UroLift seems less helpful in this situation and unclear whether or not it would be effective.  We discussed TURP in detail including risk of bleeding, infection, demonstrating structures, urinary leakage and retrograde ejaculation amongst others.  We discussed the postoperative course, need for Foley catheter and possible overnight admission for continuous bladder irrigation depending on the degree of hematuria.  All of their questions were answered.  They are provided with literature about each of their options.  We did go ahead and leave his Foley catheter out and feels bladder somewhat during cystoscopy today.  He was unable  to void in the office and ultimately has elected to return to our [place] office later this afternoon to evaluate whether or not he needs to have the catheter replaced for the short-term.  The family meeting over the weekend including with the patient's son who is also physician to assess these above options and will get back to [place].  Will hold off on further follow-up until they have made a plan.   Vanna Scotland, MD  I spent 31 total minutes on the day of the encounter including pre-visit review of the medical record, face-to-face time with the patient, and post visit ordering of labs/imaging/tests.  The aforementioned time did not include the diagnostic procedure which was a separate identifiable procedure.

## 2023-01-07 ENCOUNTER — Encounter: Payer: Self-pay | Admitting: Urology

## 2023-01-07 ENCOUNTER — Emergency Department
Admission: EM | Admit: 2023-01-07 | Discharge: 2023-01-07 | Discharge status: Against Medical Advice | Payer: Medicare HMO

## 2023-01-09 NOTE — Progress Notes (Deleted)
01/11/2023 3:52 PM   [employer] Flatt [birth year] [id number]  Urological history: 1. Urge incontinence -contributing factors of age, HTN, lumbar disc disease, arthritis and BPH.   2. BPH with LU TS -cysto (07/2021) - moderate to severe trabeculation -TRUS (07/2021) - 38 cc -UroLift (10/2021) - 6 implants   CC: No chief complaint on file.  HPI: Dennis Bond is a 89 y.o. male  who presents today for CIC instruction.   Previous records reviewed.  He had failed to 2 previous voiding trials and underwent cystoscopy with Dr. Apolinar Junes for further evaluation.  He had a relatively short prostatic length with a wide open anterior channel on the right with some billowing of tissue new the left base laterally with moderate to severe trabeculation.  After the cystoscopy, Dr. Apolinar Junes had a lengthy decision with the patient, his wife and daughter with his options moving forward which included maintaining the Foley catheter, switching to an SPT, learning self cath, a urodynamics diagnostic evaluation or pursuing a TURP.  The risks and benefits of all these options were discussed in detail.  After the appointment, they decided to have a family meeting over the weekend to include the patient's son who is also physician to assess these options and return with the decision.  He has since contacted the office and would like to learn how to CIC.   PMH: Past Medical History:  Diagnosis Date   Allergic rhinitis due to pollen    Arthritis    BPH (benign prostatic hyperplasia)    Chronic back pain    Coronary artery disease 09/05/2008   a.) LHC/PCI 09/05/2008: EF 78%, 95% pLAD, 95% mLAD, 90% RV marginal --> 2 overlapping stents (unknown type) placed to the p-mLAD   DDD (degenerative disc disease), lumbar    Diastolic dysfunction 11/20/2009   a.) TTE 11/20/2009: EF >55%, mild LVH, mild LAE, triv AR/MR/TR, G1DD   Hyperlipidemia    Hypertension    Insomnia    Lumbar spondylolysis    Medial  epicondylitis    Osteoarthritis    Poor balance    PSVT (paroxysmal supraventricular tachycardia)    Urge incontinence     Surgical History: Past Surgical History:  Procedure Laterality Date   BACK SURGERY     CAROTID STENT     09/05/2008   CYSTOSCOPY WITH INSERTION OF UROLIFT N/A 11/01/2021   Procedure: CYSTOSCOPY WITH INSERTION OF UROLIFT;  Surgeon: Vanna Scotland, MD;  Location: [facility] ORS;  Service: Urology;  Laterality: N/A;    Home Medications:  Allergies as of 01/11/2023   No Active Allergies      Medication List        Accurate as of January 09, 2023  3:52 PM. If you have any questions, ask your nurse or doctor.          aspirin EC 81 MG tablet Take 81 mg by mouth daily.   atorvastatin 20 MG tablet Commonly known as: LIPITOR TAKE 1 TABLET DAILY   lisinopril 20 MG tablet Commonly known as: ZESTRIL Take 1 tablet (20 mg total) by mouth daily.   metoprolol succinate 25 MG 24 hr tablet Commonly known as: TOPROL-XL TAKE 1 TABLET DAILY        Allergies:  No Active Allergies  Family History: No family history on file.  Social History:   reports that he has quit smoking. He has never used smokeless tobacco. He reports that he does not drink alcohol and does not use drugs.  Physical Exam:  There were no vitals taken for this visit.  Constitutional:  Well nourished. Alert and oriented, No acute distress. HEENT: [place] AT, moist mucus membranes.  Trachea midline Cardiovascular: No clubbing, cyanosis, or edema. Respiratory: Normal respiratory effort, no increased work of breathing. GU: No CVA tenderness.  No bladder fullness or masses.  Patient with circumcised/uncircumcised phallus. ***Foreskin easily retracted***  Urethral meatus is patent.  No penile discharge. No penile lesions or rashes. Scrotum without lesions, cysts, rashes and/or edema.  Testicles are located scrotally bilaterally. No masses are appreciated in the testicles. Left and right epididymis are  normal. Neurologic: Grossly intact, no focal deficits, moving all 4 extremities. Psychiatric: Normal mood and affect.   Laboratory Data: N/A  Pertinent Imaging: N/A  Assessment & Plan:    1. BPH with retention -After discussion with the patient, his wife and his son, Dr. Sabino Snipes, he agreed to have another Foley catheter placed -He will use a fleets enema tonight and start taking MiraLAX daily to facilitate more bowel movements -will have a repeat voiding trial next week    No follow-ups on file.  [name], PA-C  [facility] [address], [address] [place], [place] [address] [phone]

## 2023-01-09 NOTE — Telephone Encounter (Signed)
Dr Apolinar Junes please review, is it ok to schedule for CIC teaching? Any other feedback for family?

## 2023-01-11 ENCOUNTER — Ambulatory Visit: Payer: Medicare HMO | Admitting: Urology

## 2023-01-11 DIAGNOSIS — R339 Retention of urine, unspecified: Secondary | ICD-10-CM

## 2023-01-11 DIAGNOSIS — N138 Other obstructive and reflux uropathy: Secondary | ICD-10-CM

## 2023-01-11 NOTE — Telephone Encounter (Signed)
Patient was scheduled to see Carollee Herter at 8am on 01/11/23 for CIC teaching.  Patient arrived 22 minutes late for his appt.  We offered to reschedule or work him in later that day.    Patient left without being seen.  I called the patient's daughter, Raynelle Fanning and explained the situation.  She stated that the patient has been able to urinate on his own with no trouble.    I rescheduled the appt and asked if she could come with the patient.  She agreed.

## 2023-01-11 NOTE — Telephone Encounter (Signed)
Patient

## 2023-01-20 NOTE — Progress Notes (Unsigned)
01/24/2023 9:42 AM   Dennis Bond 23-Jul-1930 [id number]  Urological history: 1. Urge incontinence -contributing factors of age, HTN, lumbar disc disease, arthritis and BPH.   2. BPH with LU TS -cysto (07/2021) - moderate to severe trabeculation -TRUS (07/2021) - 38 cc -UroLift (10/2021) - 6 implants   CC: Chief Complaint  Patient presents with   Follow-up   HPI: Dennis Bond is a 89 y.o. male  who presents today for CIC instruction with his wife, [name].  Previous records reviewed.  He had failed to 2 previous voiding trials and underwent cystoscopy with Dr. Apolinar Junes for further evaluation.  He had a relatively short prostatic length with a wide open anterior channel on the right with some billowing of tissue new the left base laterally with moderate to severe trabeculation.  After the cystoscopy, Dr. Apolinar Junes had a lengthy decision with the patient, his wife and daughter with his options moving forward which included maintaining the Foley catheter, switching to an SPT, learning self cath, a urodynamics diagnostic evaluation or pursuing a TURP.  The risks and benefits of all these options were discussed in detail.  After the appointment, they decided to have a family meeting over the weekend to include the patient's son who is also physician to assess these options and return with the decision.  He has since contacted the office and would like to learn how to CIC.  Patient had shown up 22 minutes late for his original appointment on January 11, 2023 to be instructed on CIC.  The daughter states that he has been able to void on his own without any difficulty.  PVR 53 mL   He still continues to have urinary urgency frequency up to 10 times daily and several times during the night.  He is still wanting to learn CIC technique, per his son's suggestion, and he wants to try the Gemtesa again to see if it will help his symptoms.  He is not wanting to undergo a TURP at this time.  He states  if the [place] does not work, he may consider PTNS.  Patient denies any modifying or aggravating factors.  Patient denies any recent UTI's, gross hematuria, dysuria or suprapubic/flank pain.  Patient denies any fevers, chills, nausea or vomiting.     PMH: Past Medical History:  Diagnosis Date   Allergic rhinitis due to pollen    Arthritis    BPH (benign prostatic hyperplasia)    Chronic back pain    Coronary artery disease 09/05/2008   a.) LHC/PCI 09/05/2008: EF 78%, 95% pLAD, 95% mLAD, 90% RV marginal --> 2 overlapping stents (unknown type) placed to the p-mLAD   DDD (degenerative disc disease), lumbar    Diastolic dysfunction 11/20/2009   a.) TTE 11/20/2009: EF >55%, mild LVH, mild LAE, triv AR/MR/TR, G1DD   Hyperlipidemia    Hypertension    Insomnia    Lumbar spondylolysis    Medial epicondylitis    Osteoarthritis    Poor balance    PSVT (paroxysmal supraventricular tachycardia) (HCC)    Urge incontinence     Surgical History: Past Surgical History:  Procedure Laterality Date   BACK SURGERY     CAROTID STENT     09/05/2008   CYSTOSCOPY WITH INSERTION OF UROLIFT N/A 11/01/2021   Procedure: CYSTOSCOPY WITH INSERTION OF UROLIFT;  Surgeon: Vanna Scotland, MD;  Location: [facility] ORS;  Service: Urology;  Laterality: N/A;    Home Medications:  Allergies as of 01/24/2023   No Active  Allergies      Medication List        Accurate as of January 24, 2023  9:42 AM. If you have any questions, ask your nurse or doctor.          aspirin EC 81 MG tablet Take 81 mg by mouth daily.   atorvastatin 20 MG tablet Commonly known as: LIPITOR TAKE 1 TABLET DAILY   Gemtesa 75 MG Tabs Generic drug: Vibegron Take 1 tablet (75 mg total) by mouth daily. Started by: [name]   lisinopril 20 MG tablet Commonly known as: ZESTRIL Take 1 tablet (20 mg total) by mouth daily.   metoprolol succinate 25 MG 24 hr tablet Commonly known as: TOPROL-XL TAKE 1 TABLET DAILY         Allergies:  No Active Allergies  Family History: No family history on file.  Social History:   reports that he has quit smoking. He has never used smokeless tobacco. He reports that he does not drink alcohol and does not use drugs.  Physical Exam: BP (!) 161/96   Pulse 67   Ht 5\' 9"  (1.753 m)   Wt 165 lb (74.8 kg)   BMI 24.37 kg/m   Constitutional:  Well nourished. Alert and oriented, No acute distress. HEENT: [place] AT, moist mucus membranes.  Trachea midline Cardiovascular: No clubbing, cyanosis, or edema. Respiratory: Normal respiratory effort, no increased work of breathing. GU: No CVA tenderness.  No bladder fullness or masses.  Patient with circumcised phallus.  Urethral meatus is patent.  No penile discharge. No penile lesions or rashes. Scrotum without lesions, cysts, rashes and/or edema.  Neurologic: Grossly intact, no focal deficits, moving all 4 extremities. Psychiatric: Normal mood and affect.   Laboratory Data: N/A  Pertinent Imaging:  01/24/23 08:59  Scan Result 53 ml    Assessment & Plan:    1. BPH with retention -He watched the Coloplast video on self catheter technique -He demonstrated that he could do it without issue here in the office -I sent him home with samples of Coloplast 14 French coud catheters, male  2. OAB -He is adamant on retrying a medication and he feels the first 1 that he tried, Leslye Peer, may help him with his OAB symptoms -He is not wanting to undergo TURP as he feels it will not be helpful And gave him Gemtesa samples and since he was able to demonstrate how to catheterize himself correctly here in the office, if he should run into issues with the Gemtesa setting him back into retention he can catheterize himself without visiting the ED -If he fails Gemtesa, he would like to try the PTNS  Return in about 6 weeks (around 03/07/2023) for I PSS, PVR .  [name], PA-C  [facility] Urology [facility] [address]  [address] [place], [place] [address] [phone]

## 2023-01-24 ENCOUNTER — Ambulatory Visit: Payer: Medicare HMO | Admitting: Urology

## 2023-01-24 ENCOUNTER — Encounter: Payer: Self-pay | Admitting: Urology

## 2023-01-24 VITALS — BP 161/96 | HR 67 | Ht 69.0 in | Wt 165.0 lb

## 2023-01-24 DIAGNOSIS — N400 Enlarged prostate without lower urinary tract symptoms: Secondary | ICD-10-CM

## 2023-01-24 DIAGNOSIS — N3281 Overactive bladder: Secondary | ICD-10-CM

## 2023-01-24 DIAGNOSIS — R339 Retention of urine, unspecified: Secondary | ICD-10-CM

## 2023-01-24 LAB — BLADDER SCAN AMB NON-IMAGING: Scan Result: 53

## 2023-01-24 MED ORDER — GEMTESA 75 MG PO TABS: 75.0000 mg | ORAL_TABLET | Freq: Every day | ORAL | Status: AC

## 2023-01-30 ENCOUNTER — Ambulatory Visit: Payer: Medicare HMO | Admitting: Urology

## 2023-03-02 NOTE — Progress Notes (Signed)
03/07/2023 9:33 AM   [name] Patrici Ranks Feltman [birth year] [id number]  Urological history: 1. Urge incontinence -contributing factors of age, HTN, lumbar disc disease, arthritis and BPH.   2. BPH with LU TS -cysto (07/2021) - moderate to severe trabeculation -TRUS (07/2021) - 38 cc -UroLift (10/2021) - 6 implants   CC: Chief Complaint  Patient presents with   Follow-up   Urinary Retention   HPI: Pedro Bodee Por is a 89 y.o. male  who presents today for 6 week follow up.    Previous records reviewed.    I PSS 17/4  PVR 51 mL   He continues to have urgency and nocturia.  He states he is not getting a lot of sleep because he has to get up several times during the night.  He has not catheterized himself as he felt he has not needed to.  Patient denies any modifying or aggravating factors.  Patient denies any recent UTI's, gross hematuria, dysuria or suprapubic/flank pain.  Patient denies any fevers, chills, nausea or vomiting.   He went into retention on Sanctura and he felt the Leslye Peer was not effective.  He wants to try PTNS now.     IPSS     Row Name 03/07/23 0900         International Prostate Symptom Score   How often have you had the sensation of not emptying your bladder? About half the time     How often have you had to urinate less than every two hours? Almost always     How often have you found you stopped and started again several times when you urinated? Not at All     How often have you found it difficult to postpone urination? Almost always     How often have you had a weak urinary stream? Less than 1 in 5 times     How often have you had to strain to start urination? Not at All     How many times did you typically get up at night to urinate? 3 Times     Total IPSS Score 17       Quality of Life due to urinary symptoms   If you were to spend the rest of your life with your urinary condition just the way it is now how would you feel about that? Mostly Disatisfied               Score:  1-7 Mild 8-19 Moderate 20-35 Severe    PMH: Past Medical History:  Diagnosis Date   Allergic rhinitis due to pollen    Arthritis    BPH (benign prostatic hyperplasia)    Chronic back pain    Coronary artery disease 09/05/2008   a.) LHC/PCI 09/05/2008: EF 78%, 95% pLAD, 95% mLAD, 90% RV marginal --> 2 overlapping stents (unknown type) placed to the p-mLAD   DDD (degenerative disc disease), lumbar    Diastolic dysfunction 11/20/2009   a.) TTE 11/20/2009: EF >55%, mild LVH, mild LAE, triv AR/MR/TR, G1DD   Hyperlipidemia    Hypertension    Insomnia    Lumbar spondylolysis    Medial epicondylitis    Osteoarthritis    Poor balance    PSVT (paroxysmal supraventricular tachycardia) (HCC)    Urge incontinence     Surgical History: Past Surgical History:  Procedure Laterality Date   BACK SURGERY     CAROTID STENT     09/05/2008   CYSTOSCOPY WITH INSERTION OF UROLIFT N/A 11/01/2021  Procedure: CYSTOSCOPY WITH INSERTION OF UROLIFT;  Surgeon: Vanna Scotland, MD;  Location: [facility] ORS;  Service: Urology;  Laterality: N/A;    Home Medications:  Allergies as of 03/07/2023       Reactions   Sanctura [trospium] Other (See Comments)   Went into retention        Medication List        Accurate as of March 07, 2023  9:33 AM. If you have any questions, ask your nurse or doctor.          STOP taking these medications    trospium 20 MG tablet Commonly known as: SANCTURA       TAKE these medications    aspirin EC 81 MG tablet Take 81 mg by mouth daily.   atorvastatin 20 MG tablet Commonly known as: LIPITOR TAKE 1 TABLET DAILY   Gemtesa 75 MG Tabs Generic drug: Vibegron Take 1 tablet (75 mg total) by mouth daily.   lisinopril 20 MG tablet Commonly known as: ZESTRIL Take 1 tablet (20 mg total) by mouth daily.   metoprolol succinate 25 MG 24 hr tablet Commonly known as: TOPROL-XL TAKE 1 TABLET DAILY        Allergies:   Allergies  Allergen Reactions   Sanctura [Trospium] Other (See Comments)    Went into retention    Family History: No family history on file.  Social History:   reports that he has quit smoking. He has never used smokeless tobacco. He reports that he does not drink alcohol and does not use drugs.  Physical Exam: BP (!) 205/97   Pulse (!) 55   SpO2 98%   Constitutional:  Well nourished. Alert and oriented, No acute distress. HEENT: [place] AT, moist mucus membranes.  Trachea midline, no masses. Cardiovascular: No clubbing, cyanosis, or edema. Respiratory: Normal respiratory effort, no increased work of breathing. Neurologic: Grossly intact, no focal deficits, moving all 4 extremities. Psychiatric: Normal mood and affect.   Laboratory Data: N/A  Pertinent Imaging:  03/07/23 09:05  Scan Result 51 ml    Assessment & Plan:    1. BPH with retention -resolved  2. OAB -He has failed Sanctura (went into retention) -He has failed Gemtesa (ineffective) -he wants to try PTNS at this time explained the PTNS provides treatment by indirectly providing electrical stimulation to the nerves responsible for bladder and pelvic floor function - a needle electrode generates an adjustable electrical pulse that travels to the sacral plexus via the tibial nerve which is located in the ankle, among other functions, the sacral nerve plexus regulates bladder and pelvic floor function - treatment protocol requires once-a-week treatments for 12 weeks, 30 minutes per session and many patients begin to see improvements by the 6th treatment. Patients who respond to treatment may require occasional treatments (~ once every 3 weeks) to sustain improvements. PTNS is a low-risk procedure. The most common side-effects with PTNS treatment are temporary and minor, resulting from the placement of the needle electrode. They include minor bleeding, mild pain and skin inflammation and patients have seen up to an 80%  success rate with this form of treatment - RTC for PTNS   3. HTN -patient was found to be hypertensive in the office, we repeated the BP four times and it continued to climb (193/93, 197/101 and 205/97) an then we checked again before sending him to the ED and it was 215/101   Return for 12 weekly treatments of PTNS .  [name], PA-C  [place]  Urology [facility] [address] [place], [place] [address] [phone]

## 2023-03-07 ENCOUNTER — Other Ambulatory Visit: Payer: Self-pay

## 2023-03-07 ENCOUNTER — Encounter: Payer: Self-pay | Admitting: Urology

## 2023-03-07 ENCOUNTER — Emergency Department
Admission: EM | Admit: 2023-03-07 | Discharge: 2023-03-07 | Disposition: A | Discharge status: Home / Self Care | Payer: Medicare HMO | Attending: Emergency Medicine | Admitting: Emergency Medicine

## 2023-03-07 ENCOUNTER — Ambulatory Visit: Payer: Medicare HMO | Admitting: Urology

## 2023-03-07 VITALS — BP 215/105 | HR 65

## 2023-03-07 DIAGNOSIS — I2510 Atherosclerotic heart disease of native coronary artery without angina pectoris: Secondary | ICD-10-CM | POA: Diagnosis not present

## 2023-03-07 DIAGNOSIS — R339 Retention of urine, unspecified: Secondary | ICD-10-CM

## 2023-03-07 DIAGNOSIS — Z87438 Personal history of other diseases of male genital organs: Secondary | ICD-10-CM | POA: Diagnosis not present

## 2023-03-07 DIAGNOSIS — I10 Essential (primary) hypertension: Secondary | ICD-10-CM | POA: Insufficient documentation

## 2023-03-07 DIAGNOSIS — N3281 Overactive bladder: Secondary | ICD-10-CM

## 2023-03-07 LAB — BLADDER SCAN AMB NON-IMAGING: Scan Result: 51

## 2023-03-07 NOTE — ED Provider Notes (Signed)
[facility] Provider Note    Event Date/Time   First MD Initiated Contact with Patient 03/07/23 1014     (approximate)   History   Hypertension   HPI  Dennis Bond is a 89 y.o. male with PMH of HTH, CAD, BPH and urge incontinence presents to the ED for evaluation of HTN. Patient was at a urology appointment this morning when his BP was elevated in the 190s. He was sent over by urology for evaluation. Patient reports that he had not taken his BP meds this morning. He denies any symptoms at this time.       Physical Exam   Triage Vital Signs: ED Triage Vitals  Encounter Vitals Group     BP 03/07/23 1003 (!) 141/113     Systolic BP Percentile --      Diastolic BP Percentile --      Pulse Rate 03/07/23 1003 60     Resp 03/07/23 1003 16     Temp 03/07/23 1004 97.8 F (36.6 C)     Temp Source 03/07/23 1004 Oral     SpO2 03/07/23 1003 97 %     Weight --      Height --      Head Circumference --      Peak Flow --      Pain Score 03/07/23 1003 0     Pain Loc --      Pain Education --      Exclude from Growth Chart --     Most recent vital signs: Vitals:   03/07/23 1003 03/07/23 1004  BP: (!) 141/113   Pulse: 60   Resp: 16   Temp:  97.8 F (36.6 C)  SpO2: 97%    General: Awake, no distress.  CV:  Good peripheral perfusion. RRR. Resp:  Normal effort. CTAB. Abd:  No distention.  Other:     ED Results / Procedures / Treatments   Labs (all labs ordered are listed, but only abnormal results are displayed) Labs Reviewed - No data to display   PROCEDURES:  Critical Care performed: No  Procedures   MEDICATIONS ORDERED IN ED: Medications - No data to display   IMPRESSION / MDM / ASSESSMENT AND PLAN / ED COURSE  I reviewed the triage vital signs and the nursing notes.                             89 year old male presents for evaluation of Htn. Patient was hypertensive, otherwise VSS. NAD.  Differential diagnosis includes, but  is not limited to, hypertensive emergency, hypertensive urgency, asymptomatic hypertension, ACS, stroke.  Patient's presentation is most consistent with acute, uncomplicated illness.  Patient's BP has returned to his normal around 140s. Patient states that every time he goes to the doctor it is elevated. He denies any symptoms at this time and does not want any work up. Physical exam is reassuring and since patient is asymptomatic I don't feel that work up is absolutely necessary.   Patient is aware that he can return to the ED at any time for further work up. He voiced understanding, all questions answered, stable at discharge.     FINAL CLINICAL IMPRESSION(S) / ED DIAGNOSES   Final diagnoses:  Asymptomatic hypertension     Rx / DC Orders   ED Discharge Orders     None        Note:  This  document was prepared using Conservation officer, historic buildings and may include unintentional dictation errors.   Cameron Ali, PA-C 03/07/23 1024    Pilar Jarvis, MD 03/07/23 1550

## 2023-03-07 NOTE — ED Triage Notes (Signed)
Pt here with hypertension. Pt states when he went to his doctor and found his bp in the 200s. Pt states he is complaint with his medication. Pt

## 2023-03-07 NOTE — Discharge Instructions (Signed)
Continue to take your medications as prescribed. Return to the ED if you have any development of chest pain, shortness of breath, headache or blurry vision.

## 2023-04-07 NOTE — Progress Notes (Signed)
 04/11/2023 12:40 PM   Dennis Bond 08/07/1930 [id number]  Referring provider: [facility] [address] [place],  [place] [address]  Urological history: 1. Urge incontinence -contributing factors of age, HTN, lumbar disc disease, arthritis and BPH. -failed Sanctura  (caused retention) -failed Gemtesa  (ineffective)    2. BPH with LU TS -cysto (07/2021) - moderate to severe trabeculation -TRUS (07/2021) - 38 cc -UroLift (10/2021) - 6 implants -cysto (12/2022) -relatively short prostatic length, wide open anterior channel on the right with some billowing tissue near the left base laterally, UroLift clips seen within the prostatic fossa with slight inpatient at each location, moderate to severe trabeculation and 1 isolated UroLift tine adjacent to the bladder neck without overlying calcification  Chief Complaint  Patient presents with   PTNS    HPI: Dennis Bond is a 89 y.o. male who presents today for PTNS.   Previous records reviewed.   I could not begin the PTNS therapy today as patient shared that he is concerned about the cost of the PTNS and since other treatment modalities have not been effective and he actually feels worse he would like a second opinion at [facility].    He is voiding 30 times a day and three times at night.   Patient denies any modifying or aggravating factors.  Patient denies any recent UTI's, gross hematuria, dysuria or suprapubic/flank pain.  Patient denies any fevers, chills, nausea or vomiting.    PMH: Past Medical History:  Diagnosis Date   Allergic rhinitis due to pollen    Arthritis    BPH (benign prostatic hyperplasia)    Chronic back pain    Coronary artery disease 09/05/2008   a.) LHC/PCI 09/05/2008: EF 78%, 95% pLAD, 95% mLAD, 90% RV marginal --> 2 overlapping stents (unknown type) placed to the p-mLAD   DDD (degenerative disc disease), lumbar    Diastolic dysfunction 11/20/2009   a.) TTE 11/20/2009: EF >55%, mild LVH, mild  LAE, triv AR/MR/TR, G1DD   Hyperlipidemia    Hypertension    Insomnia    Lumbar spondylolysis    Medial epicondylitis    Osteoarthritis    Poor balance    PSVT (paroxysmal supraventricular tachycardia) (HCC)    Urge incontinence     Surgical History: Past Surgical History:  Procedure Laterality Date   BACK SURGERY     CAROTID STENT     09/05/2008   CYSTOSCOPY WITH INSERTION OF UROLIFT N/A 11/01/2021   Procedure: CYSTOSCOPY WITH INSERTION OF UROLIFT;  Surgeon: Penne Knee, MD;  Location: [facility] ORS;  Service: Urology;  Laterality: N/A;    Home Medications:  Allergies as of 04/11/2023       Reactions   Sanctura  [trospium ] Other (See Comments)   Went into retention        Medication List        Accurate as of April 11, 2023 12:40 PM. If you have any questions, ask your nurse or doctor.          aspirin EC 81 MG tablet Take 81 mg by mouth daily.   atorvastatin  20 MG tablet Commonly known as: LIPITOR TAKE 1 TABLET DAILY   Gemtesa  75 MG Tabs Generic drug: Vibegron  Take 1 tablet (75 mg total) by mouth daily.   lisinopril  20 MG tablet Commonly known as: ZESTRIL  Take 1 tablet (20 mg total) by mouth daily.   metoprolol  succinate 25 MG 24 hr tablet Commonly known as: TOPROL -XL TAKE 1 TABLET DAILY  Allergies:  Allergies  Allergen Reactions   Sanctura  [Trospium ] Other (See Comments)    Went into retention    Family History: No family history on file.  Social History:  reports that he has quit smoking. He has never used smokeless tobacco. He reports that he does not drink alcohol and does not use drugs.  ROS: Pertinent ROS in HPI  Physical Exam: Constitutional:  Well nourished. Alert and oriented, No acute distress. HEENT: [place] AT, moist mucus membranes.  Trachea midline Cardiovascular: No clubbing, cyanosis, or edema. Respiratory: Normal respiratory effort, no increased work of breathing. Neurologic: Grossly intact, no focal deficits,  moving all 4 extremities. Psychiatric: Normal mood and affect.  Laboratory Data: Lab Results  Component Value Date   WBC 6.8 05/30/2022   HGB 14.3 05/30/2022   HCT 42.1 05/30/2022   MCV 93.1 05/30/2022   PLT 187 05/30/2022    Lab Results  Component Value Date   CREATININE 0.89 05/30/2022    Lab Results  Component Value Date   AST 34 05/30/2022   Lab Results  Component Value Date   ALT 18 05/30/2022    Urinalysis See EPIC and HPI  I have reviewed the labs.   Pertinent Imaging: N/A  Assessment & Plan:    1. OAB (overactive bladder) (Primary) -He expressed to me that he felt that he is continued to get worse and not better with our recommendations for further treatment and would like to hold on the PTNS therapy at this time I did check with our office manager regarding what his out-of-pocket cost will be and she explained it is difficult to know until we have run the charges, but she will call his insurance to try to get more precise values -Will hold PTNS therapy for now and will try to get more accurate values for him concerning treatment -I will place referral to [facility] urology for refractory OAB - Ambulatory referral to Urology  Return for refer to [facility] Urology .  These notes generated with voice recognition software. I apologize for typographical errors.  CLOTILDA HELON RIGGERS  [facility] [address]  [address] [place], [place] [address] [phone]   I spent 15 minutes on the day of the encounter to include pre-visit record review, face-to-face time with the patient, and post-visit ordering of tests.

## 2023-04-11 ENCOUNTER — Ambulatory Visit: Payer: Medicare HMO | Admitting: Urology

## 2023-04-11 ENCOUNTER — Encounter: Payer: Self-pay | Admitting: Urology

## 2023-04-11 DIAGNOSIS — N3281 Overactive bladder: Secondary | ICD-10-CM | POA: Diagnosis not present

## 2023-04-18 ENCOUNTER — Ambulatory Visit: Payer: Medicare HMO | Admitting: Urology

## 2023-04-25 ENCOUNTER — Ambulatory Visit: Payer: Medicare HMO | Admitting: Urology

## 2023-05-02 ENCOUNTER — Ambulatory Visit: Payer: Medicare HMO | Admitting: Urology

## 2023-05-09 ENCOUNTER — Ambulatory Visit: Payer: Medicare HMO | Admitting: Urology

## 2023-05-17 ENCOUNTER — Ambulatory Visit: Payer: Medicare HMO | Admitting: Urology

## 2023-05-23 ENCOUNTER — Ambulatory Visit: Payer: Medicare HMO | Admitting: Urology

## 2023-05-30 ENCOUNTER — Ambulatory Visit: Payer: Medicare HMO | Admitting: Urology

## 2023-06-01 ENCOUNTER — Other Ambulatory Visit: Payer: Self-pay | Admitting: Cardiovascular Disease

## 2023-06-01 NOTE — Telephone Encounter (Signed)
Please contact pt for future appointment. Pt overdue for 12 month f/u. Pt needing refills. 

## 2023-06-05 NOTE — Telephone Encounter (Signed)
Pt scheduled on 5/13

## 2023-06-06 ENCOUNTER — Ambulatory Visit: Payer: Medicare HMO | Admitting: Urology

## 2023-06-13 ENCOUNTER — Ambulatory Visit: Payer: Medicare HMO | Admitting: Urology

## 2023-06-20 ENCOUNTER — Other Ambulatory Visit: Payer: Self-pay

## 2023-06-20 ENCOUNTER — Ambulatory Visit: Payer: Medicare HMO | Admitting: Urology

## 2023-06-20 MED ORDER — ATORVASTATIN CALCIUM 20 MG PO TABS: 20.0000 mg | ORAL_TABLET | Freq: Every day | ORAL | 0 refills | Status: DC

## 2023-06-22 ENCOUNTER — Other Ambulatory Visit: Payer: Self-pay | Admitting: Emergency Medicine

## 2023-06-22 ENCOUNTER — Telehealth: Payer: Self-pay | Admitting: Cardiovascular Disease

## 2023-06-22 MED ORDER — ATORVASTATIN CALCIUM 20 MG PO TABS: 20.0000 mg | ORAL_TABLET | Freq: Every day | ORAL | 0 refills | Status: DC

## 2023-06-22 MED ORDER — METOPROLOL SUCCINATE ER 25 MG PO TB24: 25.0000 mg | ORAL_TABLET | Freq: Every day | ORAL | 0 refills | Status: DC

## 2023-06-22 MED ORDER — LISINOPRIL 20 MG PO TABS: 20.0000 mg | ORAL_TABLET | Freq: Every day | ORAL | 0 refills | Status: DC

## 2023-06-22 NOTE — Telephone Encounter (Signed)
 Refills sent to preferred pharmacy.

## 2023-06-22 NOTE — Telephone Encounter (Signed)
*  STAT* If patient is at the pharmacy, call can be transferred to refill team.   1. Which medications need to be refilled? (please list name of each medication and dose if known) atorvastatin 20 mg, lisinopril 20 mg and metoprolol 20 mg   2. Would you like to learn more about the convenience, safety, & potential cost savings by using the [facility]? no     3. Are you open to using the [facility] (Type [facility]. no ).   4. Which pharmacy/location (including street and city if local pharmacy) is [facility] sent [facility]   5. Do they need a 30 day or 90 day supply? 90   Patient states he is out of all of his medications.

## 2023-06-27 ENCOUNTER — Ambulatory Visit: Payer: Medicare HMO | Admitting: Urology

## 2023-08-07 NOTE — Progress Notes (Deleted)
 Cardiology Office Note    Date:  08/07/2023   ID:  Dennis Bond, [place] 1931/03/05, MRN [id number]  PCP:  [facility]  Cardiologist:  Belva Boyden, MD  Electrophysiologist:  None   Chief Complaint: ***  History of Present Illness:   Dennis Bond is a 89 y.o. male with history of ***  ***   Labs independently reviewed: 05/2022 - Hgb 14.3, PLT 187, potassium 4.5, BUN 23, serum creatinine 0.89, albumin 3.6, AST/ALT normal 09/2020 - TC 149, TG 98, HDL 70, LDL 59, TSH normal  Past Medical History:  Diagnosis Date   Allergic rhinitis due to pollen    Arthritis    BPH (benign prostatic hyperplasia)    Chronic back pain    Coronary artery disease 09/05/2008   a.) LHC/PCI 09/05/2008: EF 78%, 95% pLAD, 95% mLAD, 90% RV marginal --> 2 overlapping stents (unknown type) placed to the p-mLAD   DDD (degenerative disc disease), lumbar    Diastolic dysfunction 11/20/2009   a.) TTE 11/20/2009: EF >55%, mild LVH, mild LAE, triv AR/MR/TR, G1DD   Hyperlipidemia    Hypertension    Insomnia    Lumbar spondylolysis    Medial epicondylitis    Osteoarthritis    Poor balance    PSVT (paroxysmal supraventricular tachycardia) (HCC)    Urge incontinence     Past Surgical History:  Procedure Laterality Date   BACK SURGERY     CAROTID STENT     09/05/2008   CYSTOSCOPY WITH INSERTION OF UROLIFT N/A 11/01/2021   Procedure: CYSTOSCOPY WITH INSERTION OF UROLIFT;  Surgeon: Dustin Gimenez, MD;  Location: [facility] ORS;  Service: Urology;  Laterality: N/A;    Current Medications: No outpatient medications have been marked as taking for the 08/08/23 encounter (Appointment) with Roark Chick, PA-C.    Allergies:   Sanctura  [trospium ]   Social History   Socioeconomic History   Marital status: Married    Spouse name: ,SUSIE   Number of children: Not on file   Years of education: Not on file   Highest education level: Not on file  Occupational History   Not on file  Tobacco Use    Smoking status: Former   Smokeless tobacco: Never  Vaping Use   Vaping status: Never Used  Substance and Sexual Activity   Alcohol use: Never   Drug use: Never   Sexual activity: Not on file  Other Topics Concern   Not on file  Social History Narrative   Not on file   Social Drivers of Health   Financial Resource Strain: Low Risk  (11/29/2022)   Received from [facility]   Overall Financial Resource Strain (CARDIA)    Difficulty of Paying Living Expenses: Not hard at all  Food Insecurity: No Food Insecurity (11/29/2022)   Received from [facility]   Hunger Vital Sign    Worried About Running Out of Food in the Last Year: Never true    Ran Out of Food in the Last Year: Never true  Transportation Needs: No Transportation Needs (11/29/2022)   Received from [facility] - Transportation    In the past 12 months, has lack of transportation kept you from medical appointments or from getting medications?: No    Lack of Transportation (Non-Medical): No  Physical Activity: Not on file  Stress: Not on file  Social Connections: Not on file     Family History:  The patient's family history  is not on file.  ROS:   12-point review of systems is negative unless otherwise noted in the HPI.   EKGs/Labs/Other Studies Reviewed:    Studies reviewed were summarized above. The additional studies were reviewed today: None available for review.  EKG:  EKG is ordered today.  The EKG ordered today demonstrates ***  Recent Labs: No results found for requested labs within last 365 days.  Recent Lipid Panel No results found for: "CHOL", "TRIG", "HDL", "CHOLHDL", "VLDL", "LDLCALC", "LDLDIRECT"  PHYSICAL EXAM:    VS:  There were no vitals taken for this visit.  BMI: There is no height or weight on file to calculate BMI.  Physical Exam  Wt Readings from Last 3 Encounters:  01/24/23 165 lb (74.8 kg)  12/21/22 165 lb (74.8 kg)   12/07/22 165 lb 6.4 oz (75 kg)     ASSESSMENT & PLAN:   ***   {Are you ordering a CV Procedure (e.g. stress test, cath, DCCV, TEE, etc)?   Press F2        :[id number]}     Disposition: F/u with Dr. Gollan or an APP in ***.   Medication Adjustments/Labs and Tests Ordered: Current medicines are reviewed at length with the patient today.  Concerns regarding medicines are outlined above. Medication changes, Labs and Tests ordered today are summarized above and listed in the Patient Instructions accessible in Encounters.   Signed, Varney Gentleman, PA-C 08/07/2023 10:19 AM     [facility] [place] [address] [place], [place] [address] [phone]

## 2023-08-08 ENCOUNTER — Ambulatory Visit: Admitting: Physician Assistant

## 2023-08-08 ENCOUNTER — Ambulatory Visit: Admitting: Cardiovascular Disease

## 2023-08-16 ENCOUNTER — Other Ambulatory Visit: Payer: Self-pay | Admitting: Cardiovascular Disease

## 2023-09-20 ENCOUNTER — Other Ambulatory Visit: Payer: Self-pay

## 2023-09-20 ENCOUNTER — Telehealth: Payer: Self-pay | Admitting: Cardiovascular Disease

## 2023-09-20 ENCOUNTER — Other Ambulatory Visit (HOSPITAL_COMMUNITY): Payer: Self-pay

## 2023-09-20 MED ORDER — ATORVASTATIN CALCIUM 20 MG PO TABS
20.0000 mg | ORAL_TABLET | Freq: Every day | ORAL | 0 refills | Status: DC
  Filled 2023-09-20: qty 30, 30d supply, fill #0

## 2023-09-20 NOTE — Telephone Encounter (Signed)
*  STAT* If patient is at the pharmacy, call can be transferred to refill team.   1. Which medications need to be refilled? (please list name of each medication and dose if known)   atorvastatin  (LIPITOR) 20 MG tablet    2. Which pharmacy/location (including street and city if local pharmacy) is medication to be sent to?  [facility] - [place], [place] - [address]      3. Do they need a 30 day or 90 day supply? 90 day   Pt is scheduled to come into office on 7/7.

## 2023-10-01 NOTE — Progress Notes (Unsigned)
 Cardiology Office Note  Date:  10/02/2023   ID:  Dennis Bond, Dennis Bond 05-Jun-1930, MRN [id number]  PCP:  [facility]   Chief Complaint  Patient presents with   12 month follow up     Patient c/o feeling panicky & out of it; he feels the symptoms are coming from his new medications prescribed by his PCP due to the passing of his wife. The patient's wife passed on 2003/09/14.    HPI:  [name] is a 89 yo male with PMH of CAD 09/05/2008: Cardiac cath [facility]). EF 78%, 95% prox LAD, 95% mid LAD. RV marginal 90%. Status post successful PCI to prox/mid LAD with two overlapping DES.  hyperlipidemia hypertension.  back pain Who presents for f/u of his coronary disease, prior stenting  LOV 11/23 Discussed recent events  Lost wife 6 weeks ago secondary to cancer BuSpar and lorazepam added by primary care Having anxiety, feels lost, spacey. Thinks having side effects on buspar which is being weaned now down to 1 a day from 3 a day Continues to take Lorazepam 1/2 pill in AM, full pill at night  Losing weight, 164 pounds 2 years now down to 143 Now living on his own Son and daughter are attentive and trying to do what they can to support him  Remains on lisinopril  20 daily metoprolol  succinate 25 daily Lipitor 20 daily  Blood pressure running low 100/60 Exercising on a regular basis  coronary intervention in 2010   on Lipitor 20 daily  EKG personally reviewed by myself on todays visit EKG Interpretation Date/Time:  Monday October 02 2023 16:12:53 EDT Ventricular Rate:  87 PR Interval:  172 QRS Duration:  80 QT Interval:  356 QTC Calculation: 428 R Axis:   43  Text Interpretation: Sinus rhythm with Premature atrial complexes When compared with ECG of 02-Oct-2023 16:12, No significant change was found Confirmed by Perla Lye [phone]) on 10/02/2023 4:19:22 PM    Remote stress test   echocardiogram was done in 2011 that shows normal ejection fraction.  Other past  medical history reviewed a. 09/03/2008: Exercise treadmill test. EKG positive in inferior/anterolateral leads with ST elevation in AVR, V1. Reproduced 08/2008 chest pain. Max HR 126, 10.1 METS.  b. 09/05/2008: Cardiac cath [facility]). EF 78%, 95% prox LAD, 95% mid LAD. RV marginal 90%. Status post successful PCI to prox/mid LAD with two overlapping DES.  c. Completed three month cardiac rehab [facility]  d. 01/26/2009: Exercise treadmill test [facility]). Negative adequate to max HR 133 bpm, 13.1 METS. No chest pain or arrhythmias.  D. 11/2009: ECHO: EF > 55%, trivial valvular disease  a. 01/06/2006:TC 245, TG 49, HDL 66, LDL 169.2  b. 02/02/2009: TC 143, TG 44, HDL 70, LDL 64  c. 11/16/2009: TC 148, TG 102, HDL 66, LDL 61  d. 08/17/10: TC 158, TG 49, HDL 75, LDL 73. E. 08/01/2011: TC 161, Tg 62, HDL 67, LDL 82 on Atorvastatin  40mg m F. April 2014 TC 161, TG 62, HDL 67, LDL 82 - Lipitor 20mg  G. 2017 TC 141, TG 89, HDL 49, LDL 74 - Lipitor 20mg  H. 2018 TC 151, TG 152, HDL 64, LDL 57 - Lipitor 20mg  I. 2020 TC 162, TG 84, HDL 82, LDL 82 - Lipitor 20mg   PMH:   has a past medical history of Allergic rhinitis due to pollen, Arthritis, BPH (benign prostatic hyperplasia), Chronic back pain, Coronary artery disease (09/05/2008), DDD (degenerative disc disease), lumbar, Diastolic dysfunction (  11/20/2009), Hyperlipidemia, Hypertension, Insomnia, Lumbar spondylolysis, Medial epicondylitis, Osteoarthritis, Poor balance, PSVT (paroxysmal supraventricular tachycardia) (HCC), and Urge incontinence.  PSH:    Past Surgical History:  Procedure Laterality Date   BACK SURGERY     CAROTID STENT     09/05/2008   CYSTOSCOPY WITH INSERTION OF UROLIFT N/A 11/01/2021   Procedure: CYSTOSCOPY WITH INSERTION OF UROLIFT;  Surgeon: Penne Knee, MD;  Location: [facility] ORS;  Service: Urology;  Laterality: N/A;    Current Outpatient Medications  Medication Sig Dispense Refill   aspirin 81 MG EC tablet Take 81 mg by mouth  daily.     atorvastatin  (LIPITOR) 20 MG tablet Take 1 tablet (20 mg total) by mouth daily. Must keep upcoming appointment In July for future refills. 30 tablet 0   busPIRone (BUSPAR) 5 MG tablet Take 5 mg by mouth daily.     lisinopril  (ZESTRIL ) 20 MG tablet Take 1 tablet (20 mg total) by mouth daily. Must keep upcoming appointment on 08/08/23 for future refills. 60 tablet 0   LORazepam (ATIVAN) 0.5 MG tablet Take 0.5 mg by mouth at bedtime.     metoprolol  succinate (TOPROL -XL) 25 MG 24 hr tablet Take 1 tablet (25 mg total) by mouth daily. Must keep appointment on 08/14/23 for future refills. 60 tablet 0   Vibegron  (GEMTESA ) 75 MG TABS Take 1 tablet (75 mg total) by mouth daily. (Patient not taking: Reported on 10/02/2023)     No current facility-administered medications for this visit.     Allergies:   Sanctura  [trospium ]   Social History:  The patient  reports that he has quit smoking. He has never used smokeless tobacco. He reports that he does not drink alcohol and does not use drugs.   Family History:   family history is not on file.    Review of Systems: Review of Systems  Constitutional: Negative.   HENT: Negative.    Respiratory: Negative.    Cardiovascular: Negative.   Gastrointestinal: Negative.   Musculoskeletal: Negative.   Neurological: Negative.   Psychiatric/Behavioral: Negative.    All other systems reviewed and are negative.   PHYSICAL EXAM: VS:  BP 100/60 (BP Location: Left Arm, Patient Position: Sitting, Cuff Size: Normal)   Pulse 87   Ht 5' 9 (1.753 m)   Wt 143 lb (64.9 kg)   SpO2 96%   BMI 21.12 kg/m  , BMI Body mass index is 21.12 kg/m. Constitutional:  oriented to person, place, and time. No distress.  HENT:  Head: Grossly normal Eyes:  no discharge. No scleral icterus.  Neck: No JVD, no carotid bruits  Cardiovascular: Regular rate and rhythm, no murmurs appreciated Pulmonary/Chest: Clear to auscultation bilaterally, no wheezes or rales Abdominal:  Soft.  no distension.  no tenderness.  Musculoskeletal: Normal range of motion Neurological:  normal muscle tone. Coordination normal. No atrophy Skin: Skin warm and dry Psychiatric: normal affect, pleasant   Recent Labs: No results found for requested labs within last 365 days.    Lipid Panel No results found for: CHOL, HDL, LDLCALC, TRIG    Wt Readings from Last 3 Encounters:  10/02/23 143 lb (64.9 kg)  01/24/23 165 lb (74.8 kg)  12/21/22 165 lb (74.8 kg)     ASSESSMENT AND PLAN:  Problem List Items Addressed This Visit       Cardiology Problems   Coronary artery disease involving native coronary artery of native heart - Primary   Relevant Orders   EKG 12-Lead (Completed)   Mixed hyperlipidemia  Paroxysmal supraventricular tachycardia (HCC)   Relevant Orders   EKG 12-Lead (Completed)   Other Visit Diagnoses       Benign essential HTN       Relevant Orders   EKG 12-Lead (Completed)      Coronary artery disease with stable angina  Currently with no symptoms of angina. No further workup at this time. Continue current medication regimen.  Essential hypertension Blood pressure running low, recent 20 pound weight loss over the past 2 years Recommend he decrease lisinopril  from 20 down to 10 continue metoprolol  succinate 25 Close monitoring of blood pressure Recommend he stay hydrated  Hyperlipidemia Continue Lipitor 20, numbers at goal  Adjustment disorder Grieving after loss of his wife from cancer Weaning off BuSpar, would like to stay on lorazepam as this is more mild Still feels lost    Signed, Velinda Lunger, M.D., Ph.D. [facility] [place], [place] [phone]

## 2023-10-02 ENCOUNTER — Encounter: Payer: Self-pay | Admitting: Cardiovascular Disease

## 2023-10-02 ENCOUNTER — Ambulatory Visit: Attending: Cardiovascular Disease | Admitting: Cardiovascular Disease

## 2023-10-02 ENCOUNTER — Other Ambulatory Visit (HOSPITAL_COMMUNITY): Payer: Self-pay

## 2023-10-02 VITALS — BP 100/60 | HR 87 | Ht 69.0 in | Wt 143.0 lb

## 2023-10-02 DIAGNOSIS — E782 Mixed hyperlipidemia: Secondary | ICD-10-CM

## 2023-10-02 DIAGNOSIS — I25118 Atherosclerotic heart disease of native coronary artery with other forms of angina pectoris: Secondary | ICD-10-CM

## 2023-10-02 DIAGNOSIS — I10 Essential (primary) hypertension: Secondary | ICD-10-CM | POA: Diagnosis not present

## 2023-10-02 DIAGNOSIS — I471 Supraventricular tachycardia, unspecified: Secondary | ICD-10-CM

## 2023-10-02 MED ORDER — LISINOPRIL 10 MG PO TABS: 10.0000 mg | ORAL_TABLET | Freq: Every day | ORAL | 3 refills | Status: AC

## 2023-10-02 MED ORDER — METOPROLOL SUCCINATE ER 25 MG PO TB24: 25.0000 mg | ORAL_TABLET | Freq: Every day | ORAL | 3 refills | Status: AC

## 2023-10-02 MED ORDER — ATORVASTATIN CALCIUM 20 MG PO TABS: 20.0000 mg | ORAL_TABLET | Freq: Every day | ORAL | 3 refills | Status: DC

## 2023-10-02 NOTE — Patient Instructions (Addendum)
 Medication Instructions:  Please decrease the lisinopril  down to 10 mg daily  If you need a refill on your cardiac medications before your next appointment, please call your pharmacy.   Lab work: No new labs needed  Testing/Procedures: No new testing needed  Follow-Up: At [facility], you and your health needs are our priority.  As part of our continuing mission to provide you with exceptional heart care, we have created designated Provider Care Teams.  These Care Teams include your primary Cardiologist (physician) and Advanced Practice Providers (APPs -  Physician Assistants and Nurse Practitioners) who all work together to provide you with the care you need, when you need it.  You will need a follow up appointment in 6 months, APP ok  Providers on your designated Care Team:   Lonni Meager, NP Bernardino Bring, PA-C Cadence [place], [place]  COVID-19 Vaccine Information can be found at: [email or web address] For questions related to vaccine distribution or appointments, please email [email or web address] or call [phone].

## 2023-10-10 ENCOUNTER — Encounter: Payer: Self-pay | Admitting: Cardiovascular Disease

## 2023-10-11 MED ORDER — ATORVASTATIN CALCIUM 20 MG PO TABS: 20.0000 mg | ORAL_TABLET | Freq: Every day | ORAL | 3 refills | Status: AC

## 2023-10-21 IMAGING — CT CT HEAD W/O CM
4 series · 16 of 47 positions shown, 18 images · non-contrast
Comparison: None.

CLINICAL DATA: Altered mental status



[Series 2: head wo · axial · 0.46mm/px · z∈[-168,-38]mm · 7 of 36 slices shown, 9 images]
[im 5/36  brain]
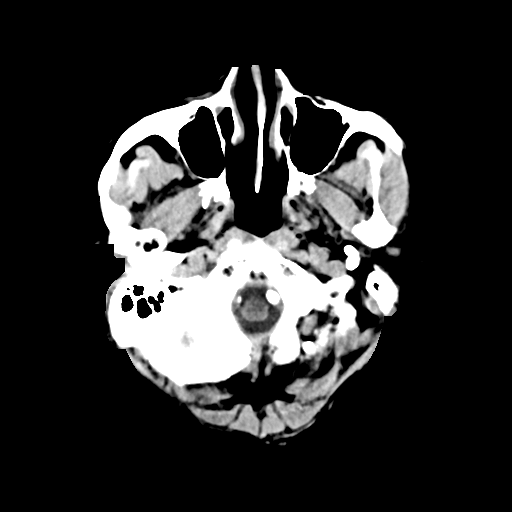
[im 5/36  bone]
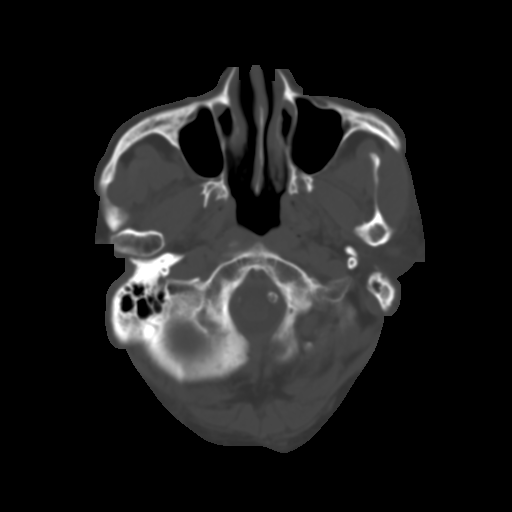
[im 9/36  brain]
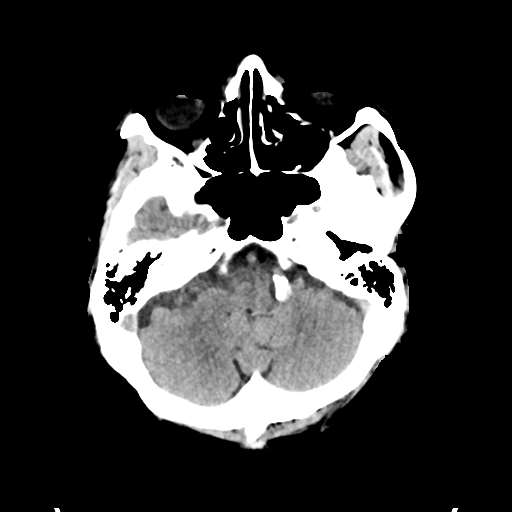
[im 14/36  brain]
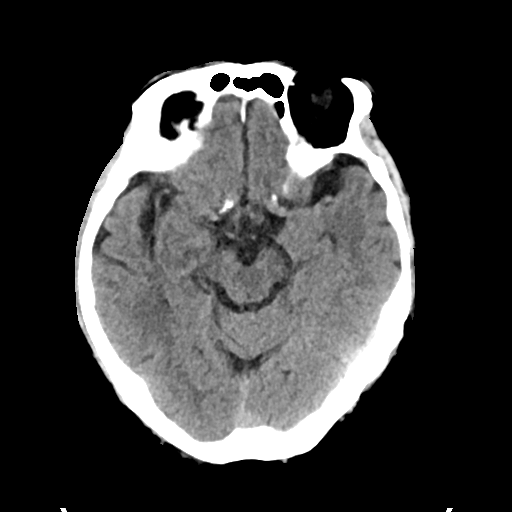
[im 18/36  brain]
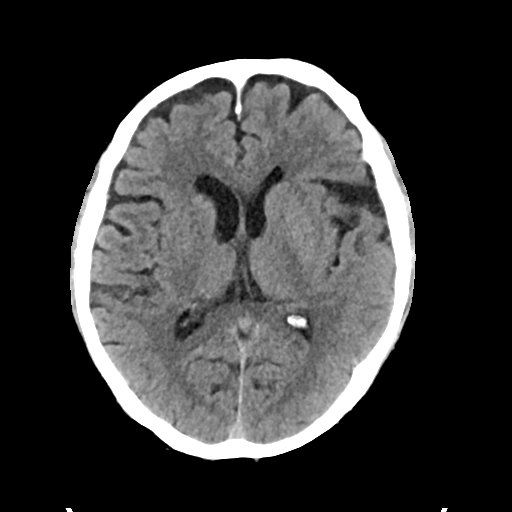
[im 22/36  brain]
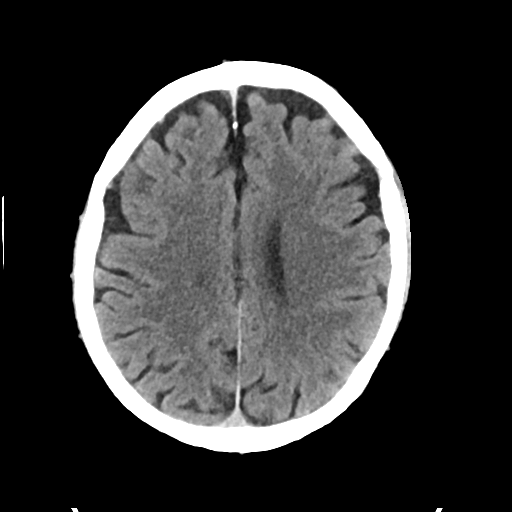
[im 22/36  bone]
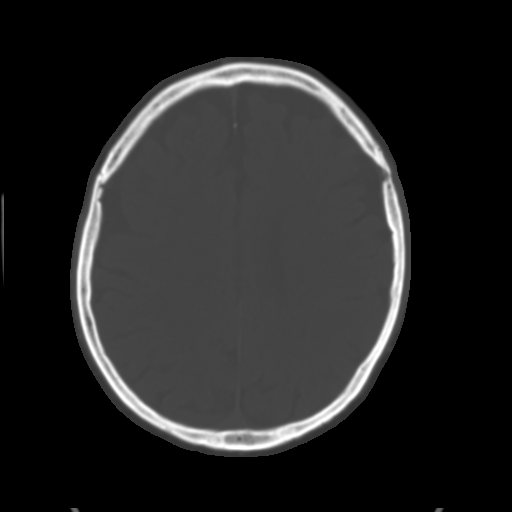
[im 27/36  brain]
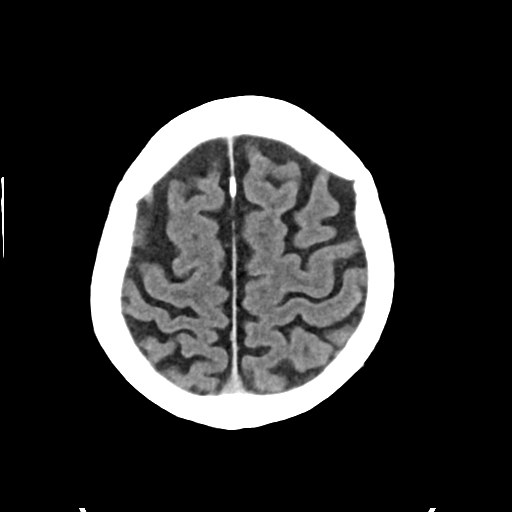
[im 31/36  brain]
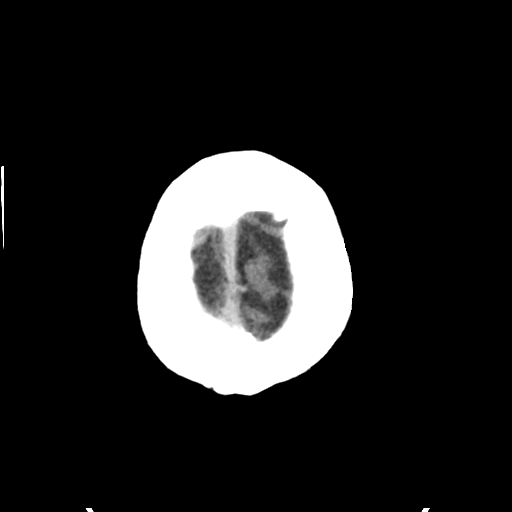

[Series 3: head bone · axial · 0.46mm/px · z∈[-172,-136]mm · 3 of 90 slices shown]
[im 9/90  bone]
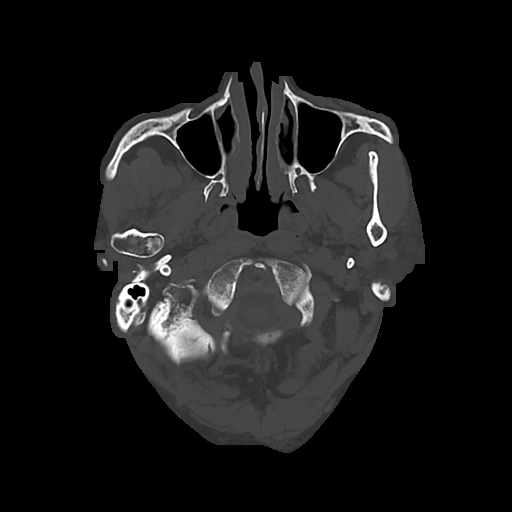
[im 18/90  bone]
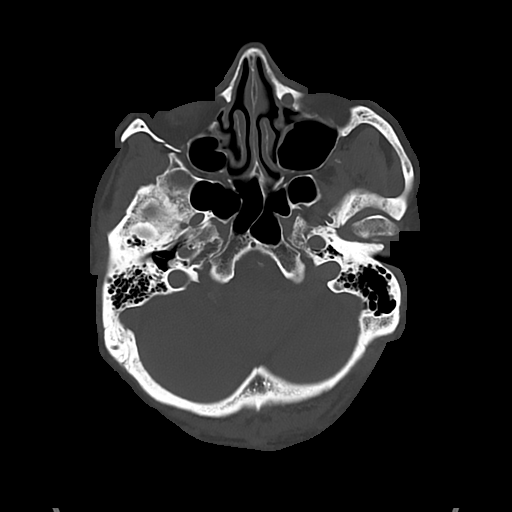
[im 27/90  bone]
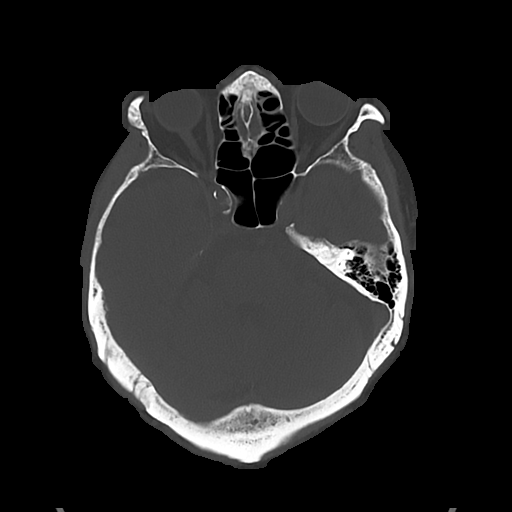

[Series 4: cor soft · coronal · 0.33mm/px · 3 of 79 slices shown]
[im 27/79  brain]
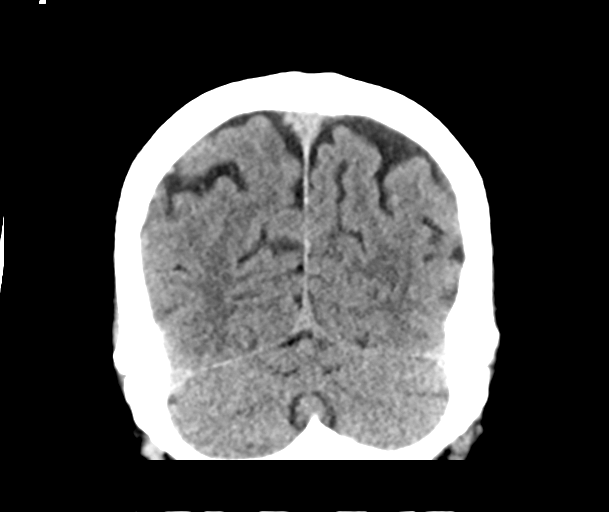
[im 35/79  brain]
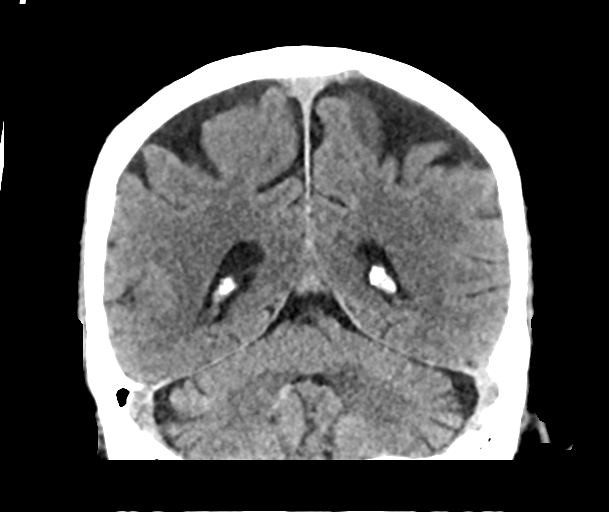
[im 44/79  brain]
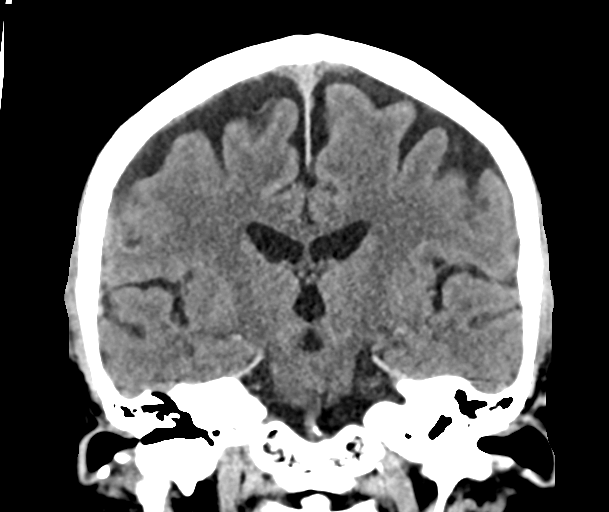

[Series 5: sag soft · sagittal · 0.33mm/px · 3 of 68 slices shown]
[im 23/68  brain]
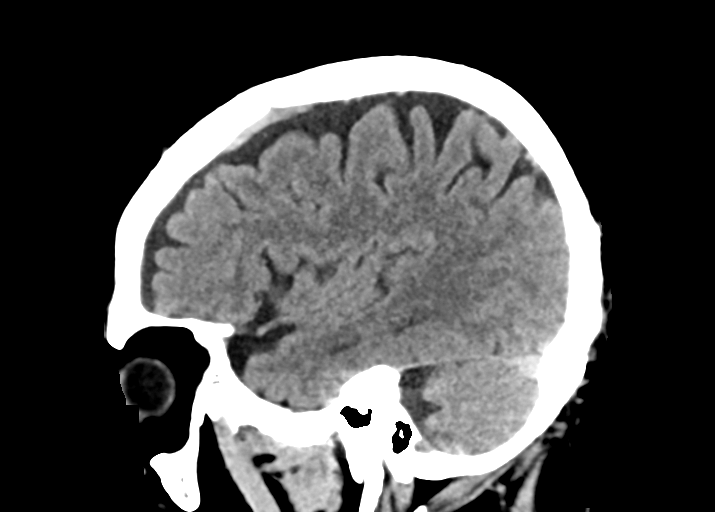
[im 34/68  brain]
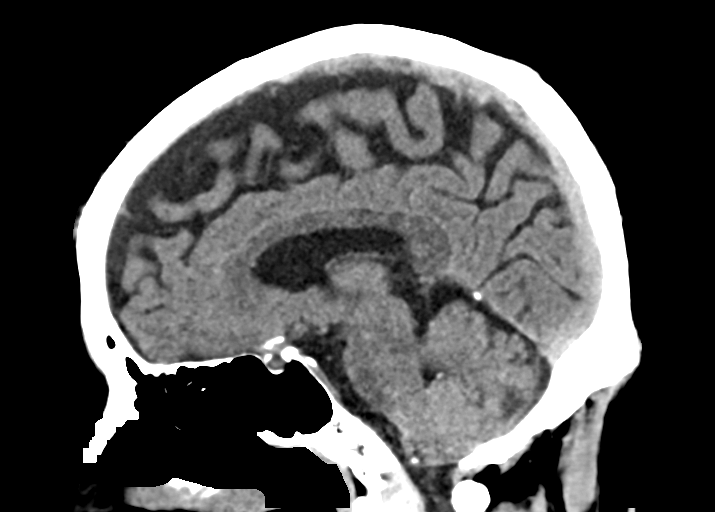
[im 45/68  brain]
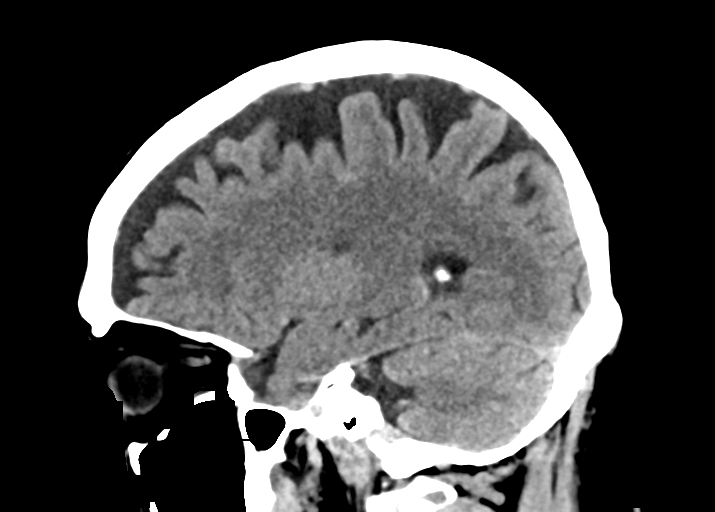

[16 of 47 positions shown; findings below may reference images not displayed]

FINDINGS: Brain: No acute intracranial findings are seen. There are no signs
of bleeding. There are no epidural or subdural fluid collections.
Cortical sulci are prominent. Small old lacunar infarcts are seen in
the basal ganglia.

Vascular: There are scattered arterial calcifications.

Skull: Unremarkable.

Sinuses/Orbits: There is mild mucosal thickening in the ethmoid
sinus.

Other: None
IMPRESSION: No acute intracranial findings are seen in noncontrast CT brain.
Atrophy.

## 2024-02-28 ENCOUNTER — Ambulatory Visit: Admitting: Physical Therapy

## 2024-02-28 ENCOUNTER — Encounter: Payer: Self-pay | Admitting: Physical Therapy

## 2024-02-28 DIAGNOSIS — M5459 Other low back pain: Secondary | ICD-10-CM | POA: Diagnosis present

## 2024-02-28 DIAGNOSIS — R2689 Other abnormalities of gait and mobility: Secondary | ICD-10-CM | POA: Diagnosis present

## 2024-02-28 DIAGNOSIS — M533 Sacrococcygeal disorders, not elsewhere classified: Secondary | ICD-10-CM | POA: Diagnosis present

## 2024-02-28 NOTE — Therapy (Unsigned)
 OUTPATIENT PHYSICAL THERAPY EVALUATION   Patient Name: Dennis Bond MRN: [id number] DOB:Jan 02, 1931, 89 y.o., male Today's Date: 02/28/2024   PT End of Session - 02/28/24 1510     Visit Number 1    Number of Visits 10    Date for Recertification  05/08/24    PT Start Time 1500    PT Stop Time 1545    PT Time Calculation (min) 45 min    Activity Tolerance Patient tolerated treatment well;No increased pain    Behavior During Therapy WFL for tasks assessed/performed          Past Medical History:  Diagnosis Date   Allergic rhinitis due to pollen    Arthritis    BPH (benign prostatic hyperplasia)    Chronic back pain    Coronary artery disease 09/05/2008   a.) LHC/PCI 09/05/2008: EF 78%, 95% pLAD, 95% mLAD, 90% RV marginal --> 2 overlapping stents (unknown type) placed to the p-mLAD   DDD (degenerative disc disease), lumbar    Diastolic dysfunction 11/20/2009   a.) TTE 11/20/2009: EF >55%, mild LVH, mild LAE, triv AR/MR/TR, G1DD   Hyperlipidemia    Hypertension    Insomnia    Lumbar spondylolysis    Medial epicondylitis    Osteoarthritis    Poor balance    PSVT (paroxysmal supraventricular tachycardia)    Urge incontinence    Past Surgical History:  Procedure Laterality Date   BACK SURGERY     CAROTID STENT     09/05/2008   CYSTOSCOPY WITH INSERTION OF UROLIFT N/A 11/01/2021   Procedure: CYSTOSCOPY WITH INSERTION OF UROLIFT;  Surgeon: Penne Knee, MD;  Location: [facility];  Service: Urology;  Laterality: N/A;   Patient Active Problem List   Diagnosis Date Noted   Gluteal pain 07/03/2019   Sacroiliitis 07/03/2019   Discogenic syndrome, 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   Arthritis of hand, right 07/01/2015   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   Displacement of intervertebral disc of lumbar region 12/06/2013   Chronic right-sided low back pain without sciatica 10/14/2013   Lateral epicondylitis 09/10/2012   Essential hypertension 07/31/2011    Coronary artery disease involving native coronary artery of native heart 10/05/2010   Mixed hyperlipidemia 10/05/2010   Paroxysmal supraventricular tachycardia 10/05/2010    PCP:   REFERRING PROVIDER: Epifanio MD   REFERRING DIAG: OAB   Rationale for Evaluation and Treatment Rehabilitation  THERAPY DIAG:  Sacrococcygeal disorders, not elsewhere classified  Other low back pain  Other abnormalities of gait and mobility  ONSET DATE:   SUBJECTIVE:  SUBJECTIVE STATEMENT: 1) urinary frequency: Pt needs to go to the urinate 2 x an hour when drinking tea or coffee./ beer.  Pt tried two medications which made it worst. Urolift procedure also did not help.  Pt has to go the bathroom again after going already when he cooks and return to the kitchen and when brushing teeth .  Pt used to have to wear dipaers but the condition got better.  Lately, pt make sure where the bathroom is and he has to get there within 2 -3 sec. Pt does not wear a pad.    Daily fluid intake: Pt drinks tea, coffee, beer,   2) Pt gets up 2-3 x a night to urinate. This started atleast the pat 3 years. Pt is not able to go back to sleep after getting up. Pt has take OTC medication to get back to sleep within an hour. Sometimes it works, sometimes it does not work to help him sleep. Pt sleeps for 4 hours based on his fit bit.    Pt has a cardiac stent.   3) L LBP, non radiating:  3-4/10 occurs when he is driving too long and over tired ( cooking, yardwork).  Pt has had LBP for 20-30 years and has had back surgeries but it did not work. Pt stopped playing golf for 5 years.  Pt takes Alleve and uses patches which help.      PERTINENT HISTORY:    PAIN:  Are you having pain? Yes: see above   PRECAUTIONS: No   WEIGHT BEARING  RESTRICTIONS: No   FALLS:  Has patient fallen in last 6 months? No   LIVING ENVIRONMENT: Lives with: alone  Lives in: at [facility] community.  Stairs: No  Has following equipment at home: None   OCCUPATION: retired sport and exercise psychologist   PLOF: IND   PATIENT GOALS:  Pt would like to not got to the bathroom 20 x a day.    OBJECTIVE:     OBJECTIVE:        HOME EXERCISE PROGRAM: See pt instruction section    ASSESSMENT:  CLINICAL IMPRESSION: Pt is a   yo  who presents with  following issues which impact QOL, ADL,  fitness, social and community activities:   1) urinary frequency  2) Pt gets up 2-3 x a night to urinate  3) L LBP, non radiating  Pt's musculoskeletal assessment revealed uneven pelvic girdle and shoulder height, asymmetries to gait pattern, limited spinal /pelvic mobility, dyscoordination and strength of pelvic floor mm, hip weakness, poor body mechanics which places strain on the abdominal/pelvic floor mm. These are deficits that indicate an ineffective intraabdominal pressure system associated with increased risk for pt's Sx.     Pt will benefit from propioception/ coordination/ body mechanics training and education with gravity-loaded tasks at work and home and  fitness modifications in order to gain a more effective intraabdominal pressure system to minimize Sx. Advised pt to not perform sit-ups and crunches as these movement patterns lead to more downward forces on the pelvic floor, negatively impacting abdominopelvic/spinal dysfunctions.   Pt was provided education on etiology of Sx with anatomy, physiology explanation with images along with the benefits of customized pelvic PT Tx based on pt's medical conditions and musculoskeletal deficits.  Explained the physiology of deep core mm coordination and roles of pelvic floor function in urination, defecation, sexual function, and postural control with deep core mm system, and the role of posture and  alignment to  help pelvic issues.     Following Tx today which pt tolerated without complaints,  pt demo'd proper body mechanics to minimize straining pelvic floor.  Plan to address realignment of spine/ pelvis at next session to help promote optimize intra-abdominal pressure system (IAP)  for improved pelvic floor function, trunk stability, gait, balance, stabilization with mobility tasks.  Plan to address pelvic floor issues once pelvis and spine are realigned to yield better outcomes.    Regional interdependent approaches will yield greater benefits in pt's POC due to the complexity of pt's medical Hx and the significant impact their Sx have had on their QOL. Pt would benefit from a biopsychosocial approach to yield optimal outcomes. Plan to build interdisciplinary team with pt's providers to optimize patient-centered care as needed.                                                      Pt benefits from skilled PT.    OBJECTIVE IMPAIRMENTS decreased activity tolerance, decreased coordination, decreased endurance, decreased mobility, difficulty walking, decreased ROM, decreased strength, decreased safety awareness, hypomobility, increased muscle spasms, impaired flexibility, improper body mechanics, postural dysfunction, and pain. scar restrictions   ACTIVITY LIMITATIONS  self-care,  sleep, home chores, work tasks    PARTICIPATION LIMITATIONS:  community, gym activities  ( walking ,  yoga, tai  chi)     PERSONAL FACTORS   affecting patient's functional outcome:    REHAB POTENTIAL: Good   CLINICAL DECISION MAKING: Evolving/moderate complexity   EVALUATION COMPLEXITY: Moderate    PATIENT EDUCATION:    Education details: Showed pt anatomy images. Explained muscles attachments/ connection, physiology of deep core system/ spinal- thoracic-pelvis-lower kinetic chain as they relate to pt's presentation, Sx, and past Hx. Explained what and how these areas of deficits need to be restored to  balance and function    See Therapeutic activity / neuromuscular re-education section  Answered pt's questions.   Person educated: Patient Education method: Explanation, Demonstration, Tactile cues, Verbal cues, and Handouts Education comprehension: verbalized understanding, returned demonstration, verbal cues required, tactile cues required, and needs further education     PLAN: PT FREQUENCY: 1x/week   PT DURATION: 10 weeks   PLANNED INTERVENTIONS:   Gait training;Stair training;Functional mobility training;DME Instruction;Therapeutic activities;Therapeutic exercise;Balance training;Neuromuscular re-education;Patient/family education;Vestibular;Visual/perceptual remediation/compensation;Passive range of motion;Moist Heat;Cryotherapy;Traction;Canalith Repostioning;Joint Manipulations;Manual lymph drainage;Manual techniques;Scar mobilization;Energy conservation;Dry needling;ADLs/Self Care Home Management;Biofeedback;Electrical Stimulation;Taping    PLAN FOR NEXT SESSION: See clinical impression for plan     GOALS: Goals reviewed with patient? Yes  SHORT TERM GOALS: Target date: 03/27/2024    Pt will demo IND with HEP                    Baseline: Not IND            Goal status: INITIAL   LONG TERM GOALS: Target date: 05/08/2024    1.Pt will demo proper deep core coordination without chest breathing and optimal excursion of diaphragm/pelvic floor in order to promote spinal stability and pelvic floor function  Baseline: dyscoordination Goal status: INITIAL  2.  Pt will demo proper body mechanics in against gravity tasks and ADLs  work tasks, fitness  to minimize straining pelvic floor / back    Baseline: not IND, improper form that places strain on pelvic floor  Goal status: INITIAL  3. Pt will demo increased gait speed > 1.3 m/s with reciprocal gait pattern, longer stride length  in order to ambulate safely in community and return to fitness routine  Baseline:  Goal  status: INITIAL    4. Pt will demo levelled pelvic girdle and shoulder height in order to progress to deep core strengthening HEP and restore mobility at spine, pelvis, gait, posture minimize falls, and improve balance  Baseline:  Goal status: INITIAL   5. Pt will improve IPPS questionnaire to  pts  score change  to demo improved QOL  Baseline:    ( greater pts indicate greater negative impact on QOL)   Baseline:  Goal status: INITIAL  6.  Pt willf/u with PCP / cardiologist for sleep study to rule in.out OSA to help with nocturia  Baseline:   Pt hs not been tested for sleep study to rule in/ out OSA  Goal Status: INITIAL   7.  Baseline: Pt  has to go the bathroom again after going already when he cooks and return to the kitchen and when brushing teeth   Goal Status: INITIAL   Pia Lupe Plump, PT 02/28/2024, 3:22 PM

## 2024-03-06 ENCOUNTER — Ambulatory Visit: Admitting: Physical Therapy

## 2024-03-06 DIAGNOSIS — R2689 Other abnormalities of gait and mobility: Secondary | ICD-10-CM

## 2024-03-06 DIAGNOSIS — M533 Sacrococcygeal disorders, not elsewhere classified: Secondary | ICD-10-CM | POA: Diagnosis not present

## 2024-03-06 DIAGNOSIS — M5459 Other low back pain: Secondary | ICD-10-CM

## 2024-03-06 NOTE — Patient Instructions (Signed)
°  Avoid straining pelvic floor, abdominal muscles , spine  Use log rolling technique instead of getting out of bed with your neck or the sit-up     Log rolling into and out of bed   Log rolling into and out of bed If getting out of bed on R side, Bent knees, scoot hips/ shoulder to L  Raise R arm completely overhead, rolling onto armpit  Then lower bent knees to bed to get into complete side lying position  Then drop legs off bed, and push up onto R elbow/forearm, and use L hand to push onto the bed    Dig elbows and feet to lift hte buttocks and scoot without lifting head   __  Do this one sided stretch morning and night    Lengthen Back rib by L  shoulder ( winging)  Lie on R  side , pillow between knees and under head  Pull  L arm overhead over mattress, grab the edge of mattress,pull it upward, drawing elbow away from ears  Breathing 15 reps Brushing arm with 3/4 turn onto pillow behind back  Lying on R  side ,Pillow/ Block between knees  dragging top forearm across ribs below breast rotating 3/4 turn,  rotating  _L_ only this week ,  relax onto the pillow behind the back  and then back to other palm , maintain top palm on body whole top and not lift shoulder  15 reps  Do this side this week       Wait do both sides until we have levelled out your spine and shoulders ___

## 2024-03-06 NOTE — Therapy (Signed)
 OUTPATIENT PHYSICAL THERAPY TREATMENT    Patient Name: Dennis Bond MRN: [id number] DOB:09/20/1930, 89 y.o., male Today's Date: 03/06/2024   PT End of Session - 03/06/24 1542     Visit Number 2    Number of Visits 10    Date for Recertification  05/08/24    PT Start Time 1504    PT Stop Time 1545    PT Time Calculation (min) 41 min    Activity Tolerance Patient tolerated treatment well;No increased pain    Behavior During Therapy WFL for tasks assessed/performed          Past Medical History:  Diagnosis Date   Allergic rhinitis due to pollen    Arthritis    BPH (benign prostatic hyperplasia)    Chronic back pain    Coronary artery disease 09/05/2008   a.) LHC/PCI 09/05/2008: EF 78%, 95% pLAD, 95% mLAD, 90% RV marginal --> 2 overlapping stents (unknown type) placed to the p-mLAD   DDD (degenerative disc disease), lumbar    Diastolic dysfunction 11/20/2009   a.) TTE 11/20/2009: EF >55%, mild LVH, mild LAE, triv AR/MR/TR, G1DD   Hyperlipidemia    Hypertension    Insomnia    Lumbar spondylolysis    Medial epicondylitis    Osteoarthritis    Poor balance    PSVT (paroxysmal supraventricular tachycardia)    Urge incontinence    Past Surgical History:  Procedure Laterality Date   BACK SURGERY     CAROTID STENT     09/05/2008   CYSTOSCOPY WITH INSERTION OF UROLIFT N/A 11/01/2021   Procedure: CYSTOSCOPY WITH INSERTION OF UROLIFT;  Surgeon: Penne Knee, MD;  Location: [facility];  Service: Urology;  Laterality: N/A;   Patient Active Problem List   Diagnosis Date Noted   Gluteal pain 07/03/2019   Sacroiliitis 07/03/2019   Discogenic syndrome, 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   Arthritis of hand, right 07/01/2015   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   Displacement of intervertebral disc of lumbar region 12/06/2013   Chronic right-sided low back pain without sciatica 10/14/2013   Lateral epicondylitis 09/10/2012   Essential hypertension  07/31/2011   Coronary artery disease involving native coronary artery of native heart 10/05/2010   Mixed hyperlipidemia 10/05/2010   Paroxysmal supraventricular tachycardia 10/05/2010    PCP:   REFERRING PROVIDER: Epifanio MD   REFERRING DIAG: OAB   Rationale for Evaluation and Treatment Rehabilitation  THERAPY DIAG:  Sacrococcygeal disorders, not elsewhere classified  Other low back pain  Other abnormalities of gait and mobility  ONSET DATE:   SUBJECTIVE:                    SUBJECTIVE STATEMENT TODAY:  Pt forgot to wear his hearing aids. Pt reported he prefers appointments earlier than 3pm  SUBJECTIVE STATEMENT ON EVAL 12/ 3/25 : 1) urinary frequency: Pt needs to go to the urinate 2 x an hour when drinking tea or coffee./ beer.  Pt tried two medications which made it worst. Urolift procedure also did not help.  Pt has to go the bathroom again after going already when he cooks and return to the kitchen and when brushing teeth .  Pt used to have to wear dipaers but the condition got better.  Lately, pt make sure where the bathroom is and he has to get there within 2 -3 sec. Pt does not wear a pad.    Daily fluid intake: Pt drinks tea, coffee, beer,   2) Pt gets up 2-3 x a night to urinate. This started atleast the pat 3 years. Pt is not able to go back to sleep after getting up. Pt has take OTC medication to get back to sleep within an hour. Sometimes it works, sometimes it does not work to help him sleep. Pt sleeps for 4 hours based on his fit bit.    Pt has a cardiac stent.   3) L LBP, non radiating:  3-4/10 occurs when he is driving too long and over tired ( cooking, yardwork).  Pt has had LBP for 20-30 years and has had back surgeries but it did not work. Pt stopped playing golf for 5 years.  Pt takes Alleve  and uses patches which help.      PERTINENT HISTORY:    PAIN:  Are you having pain? Yes: see above   PRECAUTIONS: No   WEIGHT BEARING RESTRICTIONS: No   FALLS:  Has patient fallen in last 6 months? No   LIVING ENVIRONMENT: Lives with: alone  Lives in: at [facility] retirement community.  Stairs: No  Has following equipment at home: None   OCCUPATION: retired sport and exercise psychologist   PLOF: IND   PATIENT GOALS:  Pt would like to not got to the bathroom 20 x a day.    OBJECTIVE:    [facility] PT Assessment - 03/06/24 1733       Palpation   SI assessment  L shoulder, R iliac crest lowered  L convex curve lumbar   ( Post Tx: levelled pelvis and shoulder )    Palpation comment tightness along L paraspinals, interspinals, medial scapular area L, hypomobile T/L junction      Bed Mobility   Bed Mobility --   head lift, crunch           [facility] Adult PT Treatment/Exercise - 03/06/24 1733       Therapeutic Activites    Therapeutic Activities Other Therapeutic Activities    Other Therapeutic Activities provided motor planning for log rolling to get in and out of bed ot mnimize leakage      Neuro Re-ed    Neuro Re-ed Details  provided propioceptive cues for T/L junciton stretches to realign spine and pelvis      Manual Therapy   Manual therapy comments STM/MWM at problem areas noted in assessment to realign spine and pelvis               HOME EXERCISE PROGRAM: See pt instruction section    ASSESSMENT:  CLINICAL IMPRESSION:     Following Tx today which pt tolerated without complaints,  pt demo'd proper body mechanics with logrolling  to minimize straining pelvic floor.  Manual Tx was applied to realign T/L junction and pelvis. Pt demo'd levelled shoulders and pelvis post Tx  Plan  to address realignment of spine/ pelvis at next session to help promote optimize intra-abdominal pressure system (IAP)  for improved pelvic floor function, trunk stability, gait, balance,  stabilization with mobility tasks.  Plan to address pelvic floor issues once pelvis and spine are realigned to yield better outcomes.   Plan to assess bladder diary which pt completed and returned today  Regional interdependent approaches will yield greater benefits in pt's POC due to the complexity of pt's medical Hx and the significant impact their Sx have had on their QOL. Pt would benefit from a biopsychosocial approach to yield optimal outcomes. Plan to build interdisciplinary team with pt's providers to optimize patient-centered care as needed. \  Contributing factors to consider in POC: Past lumbar surgeries Urolift procedure 2023  Nocturia 10 years 2x night  Hx of cardiac stent                                                       Pt benefits from skilled PT.    OBJECTIVE IMPAIRMENTS decreased activity tolerance, decreased coordination, decreased endurance, decreased mobility, difficulty walking, decreased ROM, decreased strength, decreased safety awareness, hypomobility, increased muscle spasms, impaired flexibility, improper body mechanics, postural dysfunction, and pain. scar restrictions   ACTIVITY LIMITATIONS  self-care,  sleep, home chores, work tasks    PARTICIPATION LIMITATIONS:  community, gym activities  ( walking ,  yoga, tai  chi)     PERSONAL FACTORS   affecting patient's functional outcome:    REHAB POTENTIAL: Good   CLINICAL DECISION MAKING: Evolving/moderate complexity   EVALUATION COMPLEXITY: Moderate    PATIENT EDUCATION:    Education details: Showed pt anatomy images. Explained muscles attachments/ connection, physiology of deep core system/ spinal- thoracic-pelvis-lower kinetic chain as they relate to pt's presentation, Sx, and past Hx. Explained what and how these areas of deficits need to be restored to balance and function    See Therapeutic activity / neuromuscular re-education section  Answered pt's questions.   Person educated:  Patient Education method: Explanation, Demonstration, Tactile cues, Verbal cues, and Handouts Education comprehension: verbalized understanding, returned demonstration, verbal cues required, tactile cues required, and needs further education     PLAN: PT FREQUENCY: 1x/week   PT DURATION: 10 weeks   PLANNED INTERVENTIONS:   Gait training;Stair training;Functional mobility training;DME Instruction;Therapeutic activities;Therapeutic exercise;Balance training;Neuromuscular re-education;Patient/family education;Vestibular;Visual/perceptual remediation/compensation;Passive range of motion;Moist Heat;Cryotherapy;Traction;Canalith Repostioning;Joint Manipulations;Manual lymph drainage;Manual techniques;Scar mobilization;Energy conservation;Dry needling;ADLs/Self Care Home Management;Biofeedback;Electrical Stimulation;Taping    PLAN FOR NEXT SESSION: See clinical impression for plan     GOALS: Goals reviewed with patient? Yes  SHORT TERM GOALS: Target date: 03/27/2024    Pt will demo IND with HEP                    Baseline: Not IND            Goal status: INITIAL   LONG TERM GOALS: Target date: 05/08/2024    1.Pt will demo proper deep core coordination without chest breathing and optimal excursion of diaphragm/pelvic floor in order to promote spinal stability and pelvic floor function  Baseline: dyscoordination Goal status: INITIAL  2.  Pt will demo proper body mechanics in against gravity tasks and ADLs  work tasks, fitness  to minimize straining pelvic floor / back    Baseline: not IND, improper form that places  strain on pelvic floor  Goal status: INITIAL    3. Pt will demo increased gait speed > 1.3 m/s with reciprocal gait pattern, longer stride length  in order to ambulate safely in community and return to fitness routine  Baseline: 1.23 m/s, decreased stance on L, limited L thoracic posterior rotation  Goal status: INITIAL    4. Pt will demo levelled pelvic girdle and  shoulder height in order to progress to deep core strengthening HEP and restore mobility at spine, pelvis, gait, posture minimize falls, and improve balance  Baseline: 58 cm  sideflexion R, 57 cm L digit III to floor ,L shoulder, R iliac crest lowered  L convex curve lumbar  Goal status: INITIAL   5. Pt will improve IPPS questionnaire to  pts  score change  to demo improved QOL  Baseline:  plan to administer next session   ( greater pts indicate greater negative impact on QOL)  Goal status: INITIAL   6.  Pt willf/u with PCP / cardiologist for sleep study to rule in.out OSA to help with nocturia  Baseline:   Pt hs not been tested for sleep study to rule in/ out OSA  Goal Status: INITIAL   7.  Pt will report decreased episodes by 50% where he has to return  going already when he cooks and return to the kitchen and when brushing teeth   Baseline: Pt  has to go the bathroom again after going already when he cooks and return to the kitchen and when brushing teeth   Goal Status: INITIAL   8. Pt will complete bladder diary to better understand ratio of water  vs bladder irritant intake and maintain bladder health.    Pt will be educated about Adine loops and cycles of micturition to understand how to minimize frequency to urinate when at kitchen /bathroom sink  Baseline: Pt does not know the amount of water/ tea/ coffee/ beer intake.  Goal Status: INITIAL   Pia Lupe Plump, PT 03/06/2024, 5:36 PM

## 2024-03-13 ENCOUNTER — Ambulatory Visit: Admitting: Physical Therapy

## 2024-03-13 DIAGNOSIS — R2689 Other abnormalities of gait and mobility: Secondary | ICD-10-CM

## 2024-03-13 DIAGNOSIS — M533 Sacrococcygeal disorders, not elsewhere classified: Secondary | ICD-10-CM | POA: Diagnosis not present

## 2024-03-13 DIAGNOSIS — M5459 Other low back pain: Secondary | ICD-10-CM

## 2024-03-13 NOTE — Patient Instructions (Signed)
 Open up ribcage on L   3)  Seated rainbow :  L arm over head to stretch  15 reps  4)  by wall  Stand forearm length from wall  L arm stretch up and down   __  You do not have to do #2

## 2024-03-14 NOTE — Therapy (Addendum)
 OUTPATIENT PHYSICAL THERAPY TREATMENT    Patient Name: Dennis Bond MRN: [id number] DOB:1930-06-02, 89 y.o., male    PT End of Session - 03/13/24 1545     Visit Number 3    Number of Visits 10    Date for Recertification  05/08/24    PT Start Time 1510    PT Stop Time 1548    PT Time Calculation (min) 38 min    Activity Tolerance Patient tolerated treatment well;No increased pain    Behavior During Therapy WFL for tasks assessed/performed          Past Medical History:  Diagnosis Date   Allergic rhinitis due to pollen    Arthritis    BPH (benign prostatic hyperplasia)    Chronic back pain    Coronary artery disease 09/05/2008   a.) LHC/PCI 09/05/2008: EF 78%, 95% pLAD, 95% mLAD, 90% RV marginal --> 2 overlapping stents (unknown type) placed to the p-mLAD   DDD (degenerative disc disease), lumbar    Diastolic dysfunction 11/20/2009   a.) TTE 11/20/2009: EF >55%, mild LVH, mild LAE, triv AR/MR/TR, G1DD   Hyperlipidemia    Hypertension    Insomnia    Lumbar spondylolysis    Medial epicondylitis    Osteoarthritis    Poor balance    PSVT (paroxysmal supraventricular tachycardia)    Urge incontinence    Past Surgical History:  Procedure Laterality Date   BACK SURGERY     CAROTID STENT     09/05/2008   CYSTOSCOPY WITH INSERTION OF UROLIFT N/A 11/01/2021   Procedure: CYSTOSCOPY WITH INSERTION OF UROLIFT;  Surgeon: Penne Knee, MD;  Location: [facility] ORS;  Service: Urology;  Laterality: N/A;   Patient Active Problem List   Diagnosis Date Noted   Gluteal pain 07/03/2019   Sacroiliitis 07/03/2019   Discogenic syndrome, 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   Arthritis of hand, right 07/01/2015   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   Displacement of intervertebral disc of lumbar region 12/06/2013   Chronic right-sided low back pain without sciatica 10/14/2013   Lateral epicondylitis 09/10/2012   Essential hypertension 07/31/2011   Coronary artery  disease involving native coronary artery of native heart 10/05/2010   Mixed hyperlipidemia 10/05/2010   Paroxysmal supraventricular tachycardia 10/05/2010    PCP:   REFERRING PROVIDER: Epifanio MD   REFERRING DIAG: OAB   Rationale for Evaluation and Treatment Rehabilitation  THERAPY DIAG:  Sacrococcygeal disorders, not elsewhere classified  Other low back pain  Other abnormalities of gait and mobility  ONSET DATE:   SUBJECTIVE:                    SUBJECTIVE STATEMENT TODAY:  Pt was confused about brushing exercise . He would like to be shown the paper first so he can read and then be explained the exercise so he can remember and understand them better  SUBJECTIVE STATEMENT ON EVAL 12/ 3/25 : 1) urinary frequency: Pt needs to go to the urinate 2 x an hour when drinking tea or coffee./ beer.  Pt tried two medications which made it worst. Urolift procedure also did not help.  Pt has to go the bathroom again after going already when he cooks and return to the kitchen and when brushing teeth .  Pt used to have to wear dipaers but the condition got better.  Lately, pt make sure where the bathroom is and he has to get there within 2 -3 sec. Pt does not wear a pad.    Daily fluid intake: Pt drinks tea, coffee, beer,   2) Pt gets up 2-3 x a night to urinate. This started atleast the pat 3 years. Pt is not able to go back to sleep after getting up. Pt has take OTC medication to get back to sleep within an hour. Sometimes it works, sometimes it does not work to help him sleep. Pt sleeps for 4 hours based on his fit bit.    Pt has a cardiac stent.   3) L LBP, non radiating:  3-4/10 occurs when he is driving too long and over tired ( cooking, yardwork).  Pt has had LBP for 20-30 years and has had back surgeries but it did not  work. Pt stopped playing golf for 5 years.  Pt takes Alleve and uses patches which help.      PERTINENT HISTORY:    PAIN:  Are you having pain? Yes: see above   PRECAUTIONS: No   WEIGHT BEARING RESTRICTIONS: No   FALLS:  Has patient fallen in last 6 months? No   LIVING ENVIRONMENT: Lives with: alone  Lives in: at [facility] retirement community.  Stairs: No  Has following equipment at home: None   OCCUPATION: retired sport and exercise psychologist   PLOF: IND   PATIENT GOALS:  Pt would like to not got to the bathroom 20 x a day.    OBJECTIVE:       [facility] PT Assessment -       Palpation   SI assessment  Pelvis levelled, L shoulder lowered , decreased posetrior rotation of thorax L    Palpation comment tightness along interspinals. paraspinals, intercostal T10 limited anteriorly              [facility] Adult PT Treatment/Exercise -       Neuro Re-ed    Neuro Re-ed Details  provided propioceptive cues for T/L junciton stretches to realign spine and pelvis with explanation      Manual Therapy   Manual therapy comments STM/MWM at problem areas noted in assessment to realign spine and pelvis               HOME EXERCISE PROGRAM: See pt instruction section    ASSESSMENT:  CLINICAL IMPRESSION:   Pt showed good carry over with levelled pelvis today from last Tx.   Following Tx today which pt tolerated without complaints,  Manual Tx was applied again today to realign T/L junction and address tightness along interspinals. paraspinals, intercostal T10 limited anteriorly Pt showed less convex curve at L  lumbar post Tx.  Plan to continue to address limited excursion of L T10 anterior excursion and lateral excursion to optimize diaphragm to help with pelvic floor function   Plan to add multidifis strengthening next session   Plan to continue to  address realignment of spine/ pelvis at next session to help promote optimize intra-abdominal  pressure system (IAP)  for  improved pelvic floor function, trunk stability, gait, balance, stabilization with mobility tasks.  Plan to address pelvic floor issues once pelvis and spine are realigned to yield better outcomes.    Plan to assess bladder diary which pt completed   Pt requested to be shown the printed exercises on paper first so he can read and then be explained the exercise so he can remember and understand them better. Abided this request. Modified simpler, one or 2 step movement exercises for better compliance.  Pt voiced he understood the new exercises today                                                                                                                                                                        Regional interdependent approaches will yield greater benefits in pt's POC due to the complexity of pt's medical Hx and the significant impact their Sx have had on their QOL. Pt would benefit from a biopsychosocial approach to yield optimal outcomes. Plan to build interdisciplinary team with pt's providers to optimize patient-centered care as needed. \  Contributing factors to consider in POC: Past lumbar surgeries Urolift procedure 2023  Nocturia 10 years 2x night  Hx of cardiac stent                                                       Pt benefits from skilled PT.    OBJECTIVE IMPAIRMENTS decreased activity tolerance, decreased coordination, decreased endurance, decreased mobility, difficulty walking, decreased ROM, decreased strength, decreased safety awareness, hypomobility, increased muscle spasms, impaired flexibility, improper body mechanics, postural dysfunction, and pain. scar restrictions   ACTIVITY LIMITATIONS  self-care,  sleep, home chores, work tasks    PARTICIPATION LIMITATIONS:  community, gym activities  ( walking ,  yoga, tai  chi)     PERSONAL FACTORS   affecting patient's functional outcome:    REHAB POTENTIAL: Good   CLINICAL DECISION MAKING:  Evolving/moderate complexity   EVALUATION COMPLEXITY: Moderate    PATIENT EDUCATION:    Education details: Showed pt anatomy images. Explained muscles attachments/ connection, physiology of deep core system/ spinal- thoracic-pelvis-lower kinetic chain as they relate to pt's presentation, Sx, and past Hx. Explained what and how these areas of deficits need to be restored to balance and function    See Therapeutic activity / neuromuscular re-education section  Answered pt's questions.   Person educated: Patient Education method: Explanation, Demonstration, Tactile cues, Verbal cues, and Handouts Education comprehension: verbalized understanding, returned demonstration, verbal cues required, tactile cues required, and needs  further education     PLAN: PT FREQUENCY: 1x/week   PT DURATION: 10 weeks   PLANNED INTERVENTIONS:   Gait training;Stair training;Functional mobility training;DME Instruction;Therapeutic activities;Therapeutic exercise;Balance training;Neuromuscular re-education;Patient/family education;Vestibular;Visual/perceptual remediation/compensation;Passive range of motion;Moist Heat;Cryotherapy;Traction;Canalith Repostioning;Joint Manipulations;Manual lymph drainage;Manual techniques;Scar mobilization;Energy conservation;Dry needling;ADLs/Self Care Home Management;Biofeedback;Electrical Stimulation;Taping    PLAN FOR NEXT SESSION: See clinical impression for plan     GOALS: Goals reviewed with patient? Yes  SHORT TERM GOALS: Target date: 03/27/2024    Pt will demo IND with HEP                    Baseline: Not IND            Goal status: INITIAL   LONG TERM GOALS: Target date: 05/08/2024    1.Pt will demo proper deep core coordination without chest breathing and optimal excursion of diaphragm/pelvic floor in order to promote spinal stability and pelvic floor function  Baseline: dyscoordination Goal status: INITIAL  2.  Pt will demo proper body mechanics in  against gravity tasks and ADLs  work tasks, fitness  to minimize straining pelvic floor / back    Baseline: not IND, improper form that places strain on pelvic floor  Goal status: INITIAL    3. Pt will demo increased gait speed > 1.3 m/s with reciprocal gait pattern, longer stride length  in order to ambulate safely in community and return to fitness routine  Baseline: 1.23 m/s, decreased stance on L, limited L thoracic posterior rotation  Goal status: INITIAL    4. Pt will demo levelled pelvic girdle and shoulder height in order to progress to deep core strengthening HEP and restore mobility at spine, pelvis, gait, posture minimize falls, and improve balance  Baseline: 58 cm  sideflexion R, 57 cm L digit III to floor ,L shoulder, R iliac crest lowered  L convex curve lumbar  Goal status: INITIAL   5. Pt will improve IPPS questionnaire to  pts  score change  to demo improved QOL  Baseline:  plan to administer next session   ( greater pts indicate greater negative impact on QOL)  Goal status: INITIAL   6.  Pt willf/u with PCP / cardiologist for sleep study to rule in.out OSA to help with nocturia  Baseline:   Pt hs not been tested for sleep study to rule in/ out OSA  Goal Status: INITIAL   7.  Pt will report decreased episodes by 50% where he has to return  going already when he cooks and return to the kitchen and when brushing teeth   Baseline: Pt  has to go the bathroom again after going already when he cooks and return to the kitchen and when brushing teeth   Goal Status: INITIAL   8. Pt will complete bladder diary to better understand ratio of water  vs bladder irritant intake and maintain bladder health.    Pt will be educated about Adine loops and cycles of micturition to understand how to minimize frequency to urinate when at kitchen /bathroom sink  Baseline: Pt does not know the amount of water/ tea/ coffee/ beer intake.  Goal Status: INITIAL   Pia Lupe Plump,  PT 03/14/2024, 8:08 AM

## 2024-03-19 ENCOUNTER — Encounter: Admitting: Physical Therapy

## 2024-03-26 ENCOUNTER — Encounter: Admitting: Physical Therapy

## 2024-04-02 ENCOUNTER — Ambulatory Visit: Attending: Infectious Diseases | Admitting: Physical Therapy

## 2024-04-02 DIAGNOSIS — M533 Sacrococcygeal disorders, not elsewhere classified: Secondary | ICD-10-CM | POA: Insufficient documentation

## 2024-04-02 DIAGNOSIS — M5459 Other low back pain: Secondary | ICD-10-CM | POA: Insufficient documentation

## 2024-04-02 DIAGNOSIS — R2689 Other abnormalities of gait and mobility: Secondary | ICD-10-CM | POA: Insufficient documentation

## 2024-04-03 ENCOUNTER — Ambulatory Visit: Admitting: Physical Therapy

## 2024-04-03 ENCOUNTER — Telehealth: Payer: Self-pay | Admitting: Physical Therapy

## 2024-04-03 DIAGNOSIS — M533 Sacrococcygeal disorders, not elsewhere classified: Secondary | ICD-10-CM

## 2024-04-03 DIAGNOSIS — M5459 Other low back pain: Secondary | ICD-10-CM | POA: Diagnosis present

## 2024-04-03 DIAGNOSIS — R2689 Other abnormalities of gait and mobility: Secondary | ICD-10-CM | POA: Diagnosis present

## 2024-04-03 NOTE — Therapy (Signed)
 " OUTPATIENT PHYSICAL THERAPY TREATMENT    Patient Name: Dennis Bond MRN: [id number] DOB:[birth year], 89 y.o., male    PT End of Session - 04/03/24 1036     Visit Number 4    Number of Visits 10    Date for Recertification  05/08/24    PT Start Time 1020    PT Stop Time 1100    PT Time Calculation (min) 40 min    Activity Tolerance Patient tolerated treatment well;No increased pain    Behavior During Therapy WFL for tasks assessed/performed          Past Medical History:  Diagnosis Date   Allergic rhinitis due to pollen    Arthritis    BPH (benign prostatic hyperplasia)    Chronic back pain    Coronary artery disease 09/05/2008   a.) LHC/PCI 09/05/2008: EF 78%, 95% pLAD, 95% mLAD, 90% RV marginal --> 2 overlapping stents (unknown type) placed to the p-mLAD   DDD (degenerative disc disease), lumbar    Diastolic dysfunction 11/20/2009   a.) TTE 11/20/2009: EF >55%, mild LVH, mild LAE, triv AR/MR/TR, G1DD   Hyperlipidemia    Hypertension    Insomnia    Lumbar spondylolysis    Medial epicondylitis    Osteoarthritis    Poor balance    PSVT (paroxysmal supraventricular tachycardia)    Urge incontinence    Past Surgical History:  Procedure Laterality Date   BACK SURGERY     CAROTID STENT     09/05/2008   CYSTOSCOPY WITH INSERTION OF UROLIFT N/A 11/01/2021   Procedure: CYSTOSCOPY WITH INSERTION OF UROLIFT;  Surgeon: Penne Knee, MD;  Location: [facility] OR[facility];  Service: Urology;  Laterality: N/A;   Patient Active Problem List   Diagnosis Date Noted   Gluteal pain 07/03/2019   Sacroiliitis 07/03/2019   Discogenic syndrome, 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   Arthritis of hand, right 07/01/2015   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   Displacement of intervertebral disc of lumbar region 12/06/2013   Chronic right-sided low back pain without sciatica 10/14/2013   Lateral epicondylitis 09/10/2012   Essential hypertension 07/31/2011   Coronary artery  disease involving native coronary artery of native heart 10/05/2010   Mixed hyperlipidemia 10/05/2010   Paroxysmal supraventricular tachycardia 10/05/2010    PCP:   REFERRING PROVIDER: Epifanio MD   REFERRING DIAG: OAB   Rationale for Evaluation and Treatment Rehabilitation  THERAPY DIAG:  Sacrococcygeal disorders, not elsewhere classified  Other low back pain  Other abnormalities of gait and mobility  ONSET DATE:   SUBJECTIVE:                    SUBJECTIVE STATEMENT TODAY:  Pt was confused about brushing exercise . He would like to be shown the paper first so he can read and then be explained the exercise so he can remember and understand them better  SUBJECTIVE STATEMENT ON EVAL 12/ 3/25 : 1) urinary frequency: Pt needs to go to the urinate 2 x an hour when drinking tea or coffee./ beer.  Pt tried two medications which made it worst. Urolift procedure also did not help.  Pt has to go the bathroom again after going already when he cooks and return to the kitchen and when brushing teeth .  Pt used to have to wear dipaers but the condition got better.  Lately, pt make sure where the bathroom is and he has to get there within 2 -3 sec. Pt does not wear a pad.    Daily fluid intake: Pt drinks tea, coffee, beer,   2) Pt gets up 2-3 x a night to urinate. This started atleast the pat 3 years. Pt is not able to go back to sleep after getting up. Pt has take OTC medication to get back to sleep within an hour. Sometimes it works, sometimes it does not work to help him sleep. Pt sleeps for 4 hours based on his fit bit.    Pt has a cardiac stent.   3) L LBP, non radiating:  3-4/10 occurs when he is driving too long and over tired ( cooking, yardwork).  Pt has had LBP for 20-30 years and has had back surgeries but it did not  work. Pt stopped playing golf for 5 years.  Pt takes Alleve and uses patches which help.      PERTINENT HISTORY:    PAIN:  Are you having pain? Yes: see above   PRECAUTIONS: No   WEIGHT BEARING RESTRICTIONS: No   FALLS:  Has patient fallen in last 6 months? No   LIVING ENVIRONMENT: Lives with: alone  Lives in: at [facility] retirement community.  Stairs: No  Has following equipment at home: None   OCCUPATION: retired sport and exercise psychologist   PLOF: IND   PATIENT GOALS:  Pt would like to not got to the bathroom 20 x a day.    OBJECTIVE:    [facility] PT Assessment - 04/03/24 1036       Palpation   SI assessment  levelled pelvis, L shoulder lower    Palpation comment L2-3 deviated to L convex curve, increased paraspinal mm tightness L           [facility] Adult PT Treatment/Exercise - 04/03/24 1057       Therapeutic Activites    Therapeutic Activities Other Therapeutic Activities    Other Therapeutic Activities encouraged stacked sitting while watching TV with BUE support, feet on floor instead of feet prpped and explained posterior tilt of pelvis affecting leaking.  MOdified TV watching and encouraged side stepping L and R exercise to increase glut strength and mm mass      Neuro Re-ed    Neuro Re-ed Details  provided tactile nd propioceptive cues for trunk rotation to lengthen convex side of lumbar curve on L      Manual Therapy   Manual therapy comments STM/MWM at problem areas noted in assessment to realign spine and pelvis             HOME EXERCISE PROGRAM: See pt instruction section    ASSESSMENT:  CLINICAL IMPRESSION:   Pt showed good carry over with levelled pelvis today from last Tx but still required manual Tx to address lumbar convex curve.   Following Tx today which pt tolerated without complaints,  Pt showed less convex curve at L  lumbar post Tx.   Added L trunk  rotation to continue lengthening L lumbar spine.   Plan to add multidifis strengthening  next session before progressing to deep core training   Plan to continue to  address realignment of spine at next session to help promote optimize intra-abdominal pressure system (IAP)  for improved pelvic floor function, trunk stability, gait, balance, stabilization with mobility tasks.  Plan to address pelvic floor issues once pelvis and spine are realigned to yield better outcomes.                                                                                                         Regional interdependent approaches will yield greater benefits in pt's POC due to the complexity of pt's medical Hx and the significant impact their Sx have had on their QOL. Pt would benefit from a biopsychosocial approach to yield optimal outcomes. Plan to build interdisciplinary team with pt's providers to optimize patient-centered care as needed. \  Contributing factors to consider in POC: Past lumbar surgeries Urolift procedure 2023  Nocturia 10 years 2x night  Hx of cardiac stent                                                       Pt benefits from skilled PT.    OBJECTIVE IMPAIRMENTS decreased activity tolerance, decreased coordination, decreased endurance, decreased mobility, difficulty walking, decreased ROM, decreased strength, decreased safety awareness, hypomobility, increased muscle spasms, impaired flexibility, improper body mechanics, postural dysfunction, and pain. scar restrictions   ACTIVITY LIMITATIONS  self-care,  sleep, home chores, work tasks    PARTICIPATION LIMITATIONS:  community, gym activities  ( walking ,  yoga, tai  chi)     PERSONAL FACTORS   affecting patient's functional outcome:    REHAB POTENTIAL: Good   CLINICAL DECISION MAKING: Evolving/moderate complexity   EVALUATION COMPLEXITY: Moderate    PATIENT EDUCATION:    Education details: Showed pt anatomy images. Explained muscles attachments/ connection, physiology of deep core system/ spinal- thoracic-pelvis-lower  kinetic chain as they relate to pt's presentation, Sx, and past Hx. Explained what and how these areas of deficits need to be restored to balance and function    See Therapeutic activity / neuromuscular re-education section  Answered pt's questions.   Person educated: Patient Education method: Explanation, Demonstration, Tactile cues, Verbal cues, and Handouts Education comprehension: verbalized understanding, returned demonstration, verbal cues required, tactile cues required, and needs further education     PLAN: PT FREQUENCY: 1x/week   PT DURATION: 10 weeks   PLANNED INTERVENTIONS:   Gait training;Stair training;Functional mobility training;DME Instruction;Therapeutic activities;Therapeutic exercise;Balance training;Neuromuscular re-education;Patient/family education;Vestibular;Visual/perceptual remediation/compensation;Passive range of motion;Moist Heat;Cryotherapy;Traction;Canalith Repostioning;Joint Manipulations;Manual lymph drainage;Manual techniques;Scar mobilization;Energy conservation;Dry needling;ADLs/Self Care Home Management;Biofeedback;Electrical Stimulation;Taping    PLAN FOR NEXT SESSION: See clinical impression for plan     GOALS: Goals reviewed with patient? Yes  SHORT TERM GOALS: Target date: 03/27/2024    Pt will demo IND with  HEP                    Baseline: Not IND            Goal status: INITIAL   LONG TERM GOALS: Target date: 05/08/2024    1.Pt will demo proper deep core coordination without chest breathing and optimal excursion of diaphragm/pelvic floor in order to promote spinal stability and pelvic floor function  Baseline: dyscoordination Goal status: INITIAL  2.  Pt will demo proper body mechanics in against gravity tasks and ADLs  work tasks, fitness  to minimize straining pelvic floor / back    Baseline: not IND, improper form that places strain on pelvic floor  Goal status: INITIAL    3. Pt will demo increased gait speed > 1.3 m/s with  reciprocal gait pattern, longer stride length  in order to ambulate safely in community and return to fitness routine  Baseline: 1.23 m/s, decreased stance on L, limited L thoracic posterior rotation  Goal status: INITIAL    4. Pt will demo levelled pelvic girdle and shoulder height in order to progress to deep core strengthening HEP and restore mobility at spine, pelvis, gait, posture minimize falls, and improve balance  Baseline: 58 cm  sideflexion R, 57 cm L digit III to floor ,L shoulder, R iliac crest lowered  L convex curve lumbar  Goal status: INITIAL   5. Pt will improve IPPS questionnaire to  pts  score change  to demo improved QOL  Baseline:  plan to administer next session   ( greater pts indicate greater negative impact on QOL)  Goal status: INITIAL   6.  Pt willf/u with PCP / cardiologist for sleep study to rule in.out OSA to help with nocturia  Baseline:   Pt hs not been tested for sleep study to rule in/ out OSA  Goal Status: INITIAL   7.  Pt will report decreased episodes by 50% where he has to return  going already when he cooks and return to the kitchen and when brushing teeth   Baseline: Pt  has to go the bathroom again after going already when he cooks and return to the kitchen and when brushing teeth   Goal Status: INITIAL   8. Pt will complete bladder diary to better understand ratio of water  vs bladder irritant intake and maintain bladder health.    Pt will be educated about Adine loops and cycles of micturition to understand how to minimize frequency to urinate when at kitchen /bathroom sink  Baseline: Pt does not know the amount of water/ tea/ coffee/ beer intake.  Goal Status: INITIAL   Pia Lupe Plump, PT 04/03/2024, 10:59 AM  "

## 2024-04-03 NOTE — Patient Instructions (Signed)
 Lengthen Back rib by L  shoulder ( winging)  Lie on R  side , pillow between knees and under head  Pull  L arm overhead over mattress, grab the edge of mattress,pull it upward, drawing elbow away from ears  Breathing 10 reps Brushing arm with 3/4 turn onto pillow behind back  Lying on R  side ,Pillow/ Block between knees  dragging top forearm across ribs below breast rotating 3/4 turn,  rotating  _L_ only this week ,  relax onto the pillow behind the back  and then back to other palm , maintain top palm on body whole top and not lift shoulder Do this side this week       Wait do both sides until we have levelled out your spine and shoulders ___     Alternate sitting with hands by hips to support stacked posture not slumped  Every commercial break or set a timer for 2-3 min, practice side stepping 3 to the L, 3 to the R to strengthen gluts

## 2024-04-03 NOTE — Telephone Encounter (Signed)
 Therapist called about missing appt yesterday. Pt stated he was out of town and thought his appt was today. Pt was worked into an opening slot for 10:15 today.

## 2024-04-10 ENCOUNTER — Ambulatory Visit: Admitting: Physical Therapy

## 2024-04-10 DIAGNOSIS — M5459 Other low back pain: Secondary | ICD-10-CM

## 2024-04-10 DIAGNOSIS — M533 Sacrococcygeal disorders, not elsewhere classified: Secondary | ICD-10-CM

## 2024-04-10 DIAGNOSIS — R2689 Other abnormalities of gait and mobility: Secondary | ICD-10-CM

## 2024-04-10 NOTE — Patient Instructions (Signed)
  ZigZag stretch  Reclined twist for hips and side of the hips/ legs  Lay on your back, knees bend, dig elbows and feet into bed to exhale and lift buttocks up to  Scoot hips and feet  to the L , leave shoulders in place Wobble knees to the R side 45 deg and to midline  10 reps   Then reach for R thigh with R hand cross body, straighten knee and point toes toward face, bend knee and repeat straigthen 10 reps to lengthen hamstring and ITband (side of hip)   Repeat on other side: Scoot hip and feet  R ,  leave shoulders in place Wobble knees to the L side 45 deg and to midline  10 reps

## 2024-04-11 NOTE — Therapy (Signed)
 " OUTPATIENT PHYSICAL THERAPY TREATMENT    Patient Name: Dennis Bond MRN: [id number] DOB:10-30-1930, 89 y.o., male    PT End of Session - 04/11/24 1211     Visit Number 5    Number of Visits 10    Date for Recertification  05/08/24    PT Start Time 1500    PT Stop Time 1545    PT Time Calculation (min) 45 min    Activity Tolerance Patient tolerated treatment well;No increased pain    Behavior During Therapy WFL for tasks assessed/performed          Past Medical History:  Diagnosis Date   Allergic rhinitis due to pollen    Arthritis    BPH (benign prostatic hyperplasia)    Chronic back pain    Coronary artery disease 09/05/2008   a.) LHC/PCI 09/05/2008: EF 78%, 95% pLAD, 95% mLAD, 90% RV marginal --> 2 overlapping stents (unknown type) placed to the p-mLAD   DDD (degenerative disc disease), lumbar    Diastolic dysfunction 11/20/2009   a.) TTE 11/20/2009: EF >55%, mild LVH, mild LAE, triv AR/MR/TR, G1DD   Hyperlipidemia    Hypertension    Insomnia    Lumbar spondylolysis    Medial epicondylitis    Osteoarthritis    Poor balance    PSVT (paroxysmal supraventricular tachycardia)    Urge incontinence    Past Surgical History:  Procedure Laterality Date   BACK SURGERY     CAROTID STENT     09/05/2008   CYSTOSCOPY WITH INSERTION OF UROLIFT N/A 11/01/2021   Procedure: CYSTOSCOPY WITH INSERTION OF UROLIFT;  Surgeon: Penne Knee, MD;  Location: [facility];  Service: Urology;  Laterality: N/A;   Patient Active Problem List   Diagnosis Date Noted   Gluteal pain 07/03/2019   Sacroiliitis 07/03/2019   Discogenic syndrome, 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   Arthritis of hand, right 07/01/2015   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   Displacement of intervertebral disc of lumbar region 12/06/2013   Chronic right-sided low back pain without sciatica 10/14/2013   Lateral epicondylitis 09/10/2012   Essential hypertension 07/31/2011   Coronary artery  disease involving native coronary artery of native heart 10/05/2010   Mixed hyperlipidemia 10/05/2010   Paroxysmal supraventricular tachycardia 10/05/2010    PCP:   REFERRING PROVIDER: Epifanio MD   REFERRING DIAG: OAB   Rationale for Evaluation and Treatment Rehabilitation  THERAPY DIAG:  Sacrococcygeal disorders, not elsewhere classified  Other low back pain  Other abnormalities of gait and mobility  ONSET DATE:   SUBJECTIVE:                    SUBJECTIVE STATEMENT TODAY:  Pt reports he is practicing the brushing and winging exercises with better understanding  SUBJECTIVE STATEMENT ON EVAL 12/ 3/25 : 1) urinary frequency: Pt needs to go to the urinate 2 x an hour when drinking tea or coffee./ beer.  Pt tried two medications which made it worst. Urolift procedure also did not help.  Pt has to go the bathroom again after going already when he cooks and return to the kitchen and when brushing teeth .  Pt used to have to wear dipaers but the condition got better.  Lately, pt make sure where the bathroom is and he has to get there within 2 -3 sec. Pt does not wear a pad.    Daily fluid intake: Pt drinks tea, coffee, beer,   2) Pt gets up 2-3 x a night to urinate. This started atleast the pat 3 years. Pt is not able to go back to sleep after getting up. Pt has take OTC medication to get back to sleep within an hour. Sometimes it works, sometimes it does not work to help him sleep. Pt sleeps for 4 hours based on his fit bit.    Pt has a cardiac stent.   3) L LBP, non radiating:  3-4/10 occurs when he is driving too long and over tired ( cooking, yardwork).  Pt has had LBP for 20-30 years and has had back surgeries but it did not work. Pt stopped playing golf for 5 years.  Pt takes Alleve and uses patches which help.       PERTINENT HISTORY:    PAIN:  Are you having pain? Yes: see above   PRECAUTIONS: No   WEIGHT BEARING RESTRICTIONS: No   FALLS:  Has patient fallen in last 6 months? No   LIVING ENVIRONMENT: Lives with: alone  Lives in: at [facility].  Stairs: No  Has following equipment at home: None   OCCUPATION: retired sport and exercise psychologist   PLOF: IND   PATIENT GOALS:  Pt would like to not got to the bathroom 20 x a day.    OBJECTIVE:    [facility] PT Assessment - 04/11/24 1212       Palpation   SI assessment  levelled pelvis, L shoulder lower    Palpation comment increased L paraspinal mm tightness, hypomobile L 2-3, intercostal between T10-12,  tightness along R T7-10 interspinal           [facility] Adult PT Treatment/Exercise - 04/11/24 1213       Neuro Re-ed    Neuro Re-ed Details  provided tactile nd propioceptive cues for zigzag stretch to optimize diaphragm excursion and lumbar spine   Reviewed past HEP for winging and brushing and logrolling technique      Modalities   Modalities Moist Heat      Moist Heat Therapy   Number Minutes Moist Heat 5 Minutes    Moist Heat Location --   L thoracic posterior rotation, supported under knees, between kenes. adjusted for comfort when pt reported low bak back after a few minutes to support under L hip     Manual Therapy   Manual therapy comments STM/MWM at problem areas noted in assessment to realign spine             HOME EXERCISE PROGRAM: See pt instruction section    ASSESSMENT:  CLINICAL IMPRESSION:   Pt showed good carry over with levelled pelvis today from last Tx but still required manual Tx to address lumbar convex curve, optimize diaphragm excursion   Following Tx today which pt tolerated without complaints,  Pt  showed less L lumbar convex curve and demo'd diaphragmatic excursion at post Tx.  Reviewed lumbar spine stretch from last session which will  continue lengthening L lumbar spine.    Plan to add multidifis strengthening next session before progressing to deep core training   Structural alignment improvements are improving and will help promote optimal intra-abdominal pressure system (IAP)  for improved pelvic floor function, trunk stability, gait, balance, stabilization with mobility tasks.  Plan to address pelvic floor issues once pelvis and spine are realigned to yield better outcomes.                                                                                                         Regional interdependent approaches will yield greater benefits in pt's POC  Contributing factors to consider in POC: Past lumbar surgeries Urolift procedure 2023  Nocturia 10 years 2x night  Hx of cardiac stent                                                       Pt benefits from skilled PT.    OBJECTIVE IMPAIRMENTS decreased activity tolerance, decreased coordination, decreased endurance, decreased mobility, difficulty walking, decreased ROM, decreased strength, decreased safety awareness, hypomobility, increased muscle spasms, impaired flexibility, improper body mechanics, postural dysfunction, and pain. scar restrictions   ACTIVITY LIMITATIONS  self-care,  sleep, home chores, work tasks    PARTICIPATION LIMITATIONS:  community, gym activities  ( walking ,  yoga, tai  chi)     PERSONAL FACTORS   affecting patient's functional outcome:    REHAB POTENTIAL: Good   CLINICAL DECISION MAKING: Evolving/moderate complexity   EVALUATION COMPLEXITY: Moderate    PATIENT EDUCATION:    Education details: Showed pt anatomy images. Explained muscles attachments/ connection, physiology of deep core system/ spinal- thoracic-pelvis-lower kinetic chain as they relate to pt's presentation, Sx, and past Hx. Explained what and how these areas of deficits need to be restored to balance and function    See Therapeutic activity / neuromuscular re-education section  Answered pt's  questions.   Person educated: Patient Education method: Explanation, Demonstration, Tactile cues, Verbal cues, and Handouts Education comprehension: verbalized understanding, returned demonstration, verbal cues required, tactile cues required, and needs further education     PLAN: PT FREQUENCY: 1x/week   PT DURATION: 10 weeks   PLANNED INTERVENTIONS:   Gait training;Stair training;Functional mobility training;DME Instruction;Therapeutic activities;Therapeutic exercise;Balance training;Neuromuscular re-education;Patient/family education;Vestibular;Visual/perceptual remediation/compensation;Passive range of motion;Moist Heat;Cryotherapy;Traction;Canalith Repostioning;Joint Manipulations;Manual lymph drainage;Manual techniques;Scar mobilization;Energy conservation;Dry needling;ADLs/Self Care Home Management;Biofeedback;Electrical Stimulation;Taping    PLAN FOR NEXT SESSION: See clinical impression for plan     GOALS: Goals reviewed with patient? Yes  SHORT TERM GOALS: Target date: 03/27/2024    Pt will demo IND with HEP                    Baseline: Not IND  Goal status: INITIAL   LONG TERM GOALS: Target date: 05/08/2024    1.Pt will demo proper deep core coordination without chest breathing and optimal excursion of diaphragm/pelvic floor in order to promote spinal stability and pelvic floor function  Baseline: dyscoordination Goal status: INITIAL  2.  Pt will demo proper body mechanics in against gravity tasks and ADLs  work tasks, fitness  to minimize straining pelvic floor / back    Baseline: not IND, improper form that places strain on pelvic floor  Goal status: INITIAL    3. Pt will demo increased gait speed > 1.3 m/s with reciprocal gait pattern, longer stride length  in order to ambulate safely in community and return to fitness routine  Baseline: 1.23 m/s, decreased stance on L, limited L thoracic posterior rotation  Goal status: INITIAL    4. Pt will  demo levelled pelvic girdle and shoulder height in order to progress to deep core strengthening HEP and restore mobility at spine, pelvis, gait, posture minimize falls, and improve balance  Baseline: 58 cm  sideflexion R, 57 cm L digit III to floor ,L shoulder, R iliac crest lowered  L convex curve lumbar  Goal status: INITIAL   5. Pt will improve IPPS questionnaire to  pts  score change  to demo improved QOL  Baseline:  plan to administer next session   ( greater pts indicate greater negative impact on QOL)  Goal status: INITIAL   6.  Pt willf/u with PCP / cardiologist for sleep study to rule in.out OSA to help with nocturia  Baseline:   Pt hs not been tested for sleep study to rule in/ out OSA  Goal Status: INITIAL   7.  Pt will report decreased episodes by 50% where he has to return  going already when he cooks and return to the kitchen and when brushing teeth   Baseline: Pt  has to go the bathroom again after going already when he cooks and return to the kitchen and when brushing teeth   Goal Status: INITIAL   8. Pt will complete bladder diary to better understand ratio of water  vs bladder irritant intake and maintain bladder health.    Pt will be educated about Adine loops and cycles of micturition to understand how to minimize frequency to urinate when at kitchen /bathroom sink  Baseline: Pt does not know the amount of water/ tea/ coffee/ beer intake.  Goal Status: INITIAL   Pia Lupe Plump, PT 04/11/2024, 12:16 PM  "

## 2024-04-16 ENCOUNTER — Ambulatory Visit: Admitting: Physical Therapy

## 2024-04-17 ENCOUNTER — Ambulatory Visit: Admitting: Physical Therapy

## 2024-04-17 DIAGNOSIS — M5459 Other low back pain: Secondary | ICD-10-CM

## 2024-04-17 DIAGNOSIS — R2689 Other abnormalities of gait and mobility: Secondary | ICD-10-CM

## 2024-04-17 DIAGNOSIS — M533 Sacrococcygeal disorders, not elsewhere classified: Secondary | ICD-10-CM | POA: Diagnosis not present

## 2024-04-17 NOTE — Patient Instructions (Signed)
Deep core level 1-2   2 x day

## 2024-04-17 NOTE — Therapy (Signed)
 " OUTPATIENT PHYSICAL THERAPY TREATMENT    Patient Name: Joshus Rogan MRN: [id number] DOB:15-Sep-1930, 89 y.o., male    PT End of Session - 04/17/24 1014     Visit Number 6    Number of Visits 10    Date for Recertification  05/08/24    PT Start Time 0852    PT Stop Time 0930    PT Time Calculation (min) 38 min    Activity Tolerance Patient tolerated treatment well;No increased pain    Behavior During Therapy WFL for tasks assessed/performed          Past Medical History:  Diagnosis Date   Allergic rhinitis due to pollen    Arthritis    BPH (benign prostatic hyperplasia)    Chronic back pain    Coronary artery disease 09/05/2008   a.) LHC/PCI 09/05/2008: EF 78%, 95% pLAD, 95% mLAD, 90% RV marginal --> 2 overlapping stents (unknown type) placed to the p-mLAD   DDD (degenerative disc disease), lumbar    Diastolic dysfunction 11/20/2009   a.) TTE 11/20/2009: EF >55%, mild LVH, mild LAE, triv AR/MR/TR, G1DD   Hyperlipidemia    Hypertension    Insomnia    Lumbar spondylolysis    Medial epicondylitis    Osteoarthritis    Poor balance    PSVT (paroxysmal supraventricular tachycardia)    Urge incontinence    Past Surgical History:  Procedure Laterality Date   BACK SURGERY     CAROTID STENT     09/05/2008   CYSTOSCOPY WITH INSERTION OF UROLIFT N/A 11/01/2021   Procedure: CYSTOSCOPY WITH INSERTION OF UROLIFT;  Surgeon: Penne Knee, MD;  Location: [facility];  Service: Urology;  Laterality: N/A;   Patient Active Problem List   Diagnosis Date Noted   Gluteal pain 07/03/2019   Sacroiliitis 07/03/2019   Discogenic syndrome, 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   Arthritis of hand, right 07/01/2015   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   Displacement of intervertebral disc of lumbar region 12/06/2013   Chronic right-sided low back pain without sciatica 10/14/2013   Lateral epicondylitis 09/10/2012   Essential hypertension 07/31/2011   Coronary artery  disease involving native coronary artery of native heart 10/05/2010   Mixed hyperlipidemia 10/05/2010   Paroxysmal supraventricular tachycardia 10/05/2010    PCP:   REFERRING PROVIDER: Epifanio MD   REFERRING DIAG: OAB   Rationale for Evaluation and Treatment Rehabilitation  THERAPY DIAG:  Sacrococcygeal disorders, not elsewhere classified  Other low back pain  Other abnormalities of gait and mobility  ONSET DATE:   SUBJECTIVE:                    SUBJECTIVE STATEMENT TODAY:  Pt reports he needs to leave at 9:30 to go to yoga. He is going to yoga classes again 4 x a week  SUBJECTIVE STATEMENT ON EVAL 12/ 3/25 : 1) urinary frequency: Pt needs to go to the urinate 2 x an hour when drinking tea or coffee./ beer.  Pt tried two medications which made it worst. Urolift procedure also did not help.  Pt has to go the bathroom again after going already when he cooks and return to the kitchen and when brushing teeth .  Pt used to have to wear dipaers but the condition got better.  Lately, pt make sure where the bathroom is and he has to get there within 2 -3 sec. Pt does not wear a pad.    Daily fluid intake: Pt drinks tea, coffee, beer,   2) Pt gets up 2-3 x a night to urinate. This started atleast the pat 3 years. Pt is not able to go back to sleep after getting up. Pt has take OTC medication to get back to sleep within an hour. Sometimes it works, sometimes it does not work to help him sleep. Pt sleeps for 4 hours based on his fit bit.    Pt has a cardiac stent.   3) L LBP, non radiating:  3-4/10 occurs when he is driving too long and over tired ( cooking, yardwork).  Pt has had LBP for 20-30 years and has had back surgeries but it did not work. Pt stopped playing golf for 5 years.  Pt takes Alleve and uses patches which help.       PERTINENT HISTORY:    PAIN:  Are you having pain? Yes: see above   PRECAUTIONS: No   WEIGHT BEARING RESTRICTIONS: No   FALLS:  Has patient fallen in last 6 months? No   LIVING ENVIRONMENT: Lives with: alone  Lives in: at [facility].  Stairs: No  Has following equipment at home: None   OCCUPATION: retired sport and exercise psychologist   PLOF: IND   PATIENT GOALS:  Pt would like to not got to the bathroom 20 x a day.    OBJECTIVE:    [facility] PT Assessment - 04/17/24 1015       Coordination   Coordination and Movement Description excessive oversue of ab mm in deep core training      Palpation   SI assessment  levelled pelvis and shoulder      Bed Mobility   Bed Mobility --   poor cary over with log rolling, head lift/ crunch         [facility] Adult PT Treatment/Exercise - 04/17/24 1015       Therapeutic Activites    Therapeutic Activities Other Therapeutic Activities    Other Therapeutic Activities logrolling motor planning, scotting in bed      Neuro Re-ed    Neuro Re-ed Details  provided tactile and propioceptive cues for deep core training optimize diaphragm excursion and less ab overuse ,cues for motor planning               HOME EXERCISE PROGRAM: See pt instruction section    ASSESSMENT:  CLINICAL IMPRESSION:    Pt showed good carry over with levelled pelvis  and levelled shoulders and thus, pt was progressed to deep core training.  Provided tactile and propioceptive cues for deep core training optimize diaphragm excursion and less ab overuse ,cues for motor planning    Plan to reassess deep core training next session    Following Tx todaPlan to add multidifis strengthening next session  Structural alignment improvements are improving and will help promote optimal intra-abdominal pressure system (  IAP)  for improved pelvic floor function, trunk stability, gait, balance, stabilization with mobility tasks.  Plan to address pelvic floor  issues once pelvis and spine are realigned to yield better outcomes.                                                                                                         Regional interdependent approaches will yield greater benefits in pt's POC  Contributing factors to consider in POC: Past lumbar surgeries Urolift procedure 2023  Nocturia 10 years 2x night  Hx of cardiac stent                                                       Pt benefits from skilled PT.    OBJECTIVE IMPAIRMENTS decreased activity tolerance, decreased coordination, decreased endurance, decreased mobility, difficulty walking, decreased ROM, decreased strength, decreased safety awareness, hypomobility, increased muscle spasms, impaired flexibility, improper body mechanics, postural dysfunction, and pain. scar restrictions   ACTIVITY LIMITATIONS  self-care,  sleep, home chores, work tasks    PARTICIPATION LIMITATIONS:  community, gym activities  ( walking ,  yoga, tai  chi)     PERSONAL FACTORS   affecting patient's functional outcome:    REHAB POTENTIAL: Good   CLINICAL DECISION MAKING: Evolving/moderate complexity   EVALUATION COMPLEXITY: Moderate    PATIENT EDUCATION:    Education details: Showed pt anatomy images. Explained muscles attachments/ connection, physiology of deep core system/ spinal- thoracic-pelvis-lower kinetic chain as they relate to pt's presentation, Sx, and past Hx. Explained what and how these areas of deficits need to be restored to balance and function    See Therapeutic activity / neuromuscular re-education section  Answered pt's questions.   Person educated: Patient Education method: Explanation, Demonstration, Tactile cues, Verbal cues, and Handouts Education comprehension: verbalized understanding, returned demonstration, verbal cues required, tactile cues required, and needs further education     PLAN: PT FREQUENCY: 1x/week   PT DURATION: 10 weeks   PLANNED  INTERVENTIONS:   Gait training;Stair training;Functional mobility training;DME Instruction;Therapeutic activities;Therapeutic exercise;Balance training;Neuromuscular re-education;Patient/family education;Vestibular;Visual/perceptual remediation/compensation;Passive range of motion;Moist Heat;Cryotherapy;Traction;Canalith Repostioning;Joint Manipulations;Manual lymph drainage;Manual techniques;Scar mobilization;Energy conservation;Dry needling;ADLs/Self Care Home Management;Biofeedback;Electrical Stimulation;Taping    PLAN FOR NEXT SESSION: See clinical impression for plan     GOALS: Goals reviewed with patient? Yes  SHORT TERM GOALS: Target date: 03/27/2024    Pt will demo IND with HEP                    Baseline: Not IND            Goal status: INITIAL   LONG TERM GOALS: Target date: 05/08/2024    1.Pt will demo proper deep core coordination without chest breathing and optimal excursion of diaphragm/pelvic floor in order to promote spinal stability and pelvic floor function  Baseline: dyscoordination Goal status: INITIAL  2.  Pt will demo proper body  mechanics in against gravity tasks and ADLs  work tasks, fitness  to minimize straining pelvic floor / back    Baseline: not IND, improper form that places strain on pelvic floor  Goal status: INITIAL    3. Pt will demo increased gait speed > 1.3 m/s with reciprocal gait pattern, longer stride length  in order to ambulate safely in community and return to fitness routine  Baseline: 1.23 m/s, decreased stance on L, limited L thoracic posterior rotation  Goal status: INITIAL    4. Pt will demo levelled pelvic girdle and shoulder height in order to progress to deep core strengthening HEP and restore mobility at spine, pelvis, gait, posture minimize falls, and improve balance  Baseline: 58 cm  sideflexion R, 57 cm L digit III to floor ,L shoulder, R iliac crest lowered  L convex curve lumbar  Goal status: INITIAL   5. Pt will  improve IPPS questionnaire to  pts  score change  to demo improved QOL  Baseline:  plan to administer next session   ( greater pts indicate greater negative impact on QOL)  Goal status: INITIAL   6.  Pt willf/u with PCP / cardiologist for sleep study to rule in.out OSA to help with nocturia  Baseline:   Pt hs not been tested for sleep study to rule in/ out OSA  Goal Status: INITIAL   7.  Pt will report decreased episodes by 50% where he has to return  going already when he cooks and return to the kitchen and when brushing teeth   Baseline: Pt  has to go the bathroom again after going already when he cooks and return to the kitchen and when brushing teeth   Goal Status: INITIAL   8. Pt will complete bladder diary to better understand ratio of water  vs bladder irritant intake and maintain bladder health.    Pt will be educated about Adine loops and cycles of micturition to understand how to minimize frequency to urinate when at kitchen /bathroom sink  Baseline: Pt does not know the amount of water/ tea/ coffee/ beer intake.  Goal Status: INITIAL   Pia Lupe Plump, PT 04/17/2024, 10:18 AM  "

## 2024-04-19 ENCOUNTER — Ambulatory Visit: Admitting: Cardiology

## 2024-04-23 ENCOUNTER — Ambulatory Visit: Admitting: Physical Therapy

## 2024-04-29 ENCOUNTER — Ambulatory Visit: Admitting: Cardiology

## 2024-04-30 ENCOUNTER — Ambulatory Visit: Attending: Infectious Diseases | Admitting: Physical Therapy

## 2024-04-30 DIAGNOSIS — M533 Sacrococcygeal disorders, not elsewhere classified: Secondary | ICD-10-CM

## 2024-04-30 DIAGNOSIS — M5459 Other low back pain: Secondary | ICD-10-CM

## 2024-04-30 DIAGNOSIS — R2689 Other abnormalities of gait and mobility: Secondary | ICD-10-CM

## 2024-04-30 NOTE — Patient Instructions (Signed)
 Buttterfly pose ( knees out back to middle)  10 reps to relax pelvic floor    Then relax knees apart , resting onto pillows under knee and ankles  5-10 min

## 2024-05-02 ENCOUNTER — Ambulatory Visit
Admission: EM | Admit: 2024-05-02 | Discharge: 2024-05-02 | Disposition: A | Discharge status: Home / Self Care | Source: Home / Self Care

## 2024-05-02 DIAGNOSIS — R350 Frequency of micturition: Secondary | ICD-10-CM | POA: Diagnosis not present

## 2024-05-02 DIAGNOSIS — N3000 Acute cystitis without hematuria: Secondary | ICD-10-CM

## 2024-05-02 LAB — POCT URINE DIPSTICK
Bilirubin, UA: NEGATIVE
Glucose, UA: NEGATIVE mg/dL
Ketones, POC UA: NEGATIVE mg/dL
Nitrite, UA: POSITIVE — AB
POC PROTEIN,UA: 30 — AB
Spec Grav, UA: 1.02
Urobilinogen, UA: 1 U/dL
pH, UA: 7.5

## 2024-05-02 MED ORDER — CEPHALEXIN 500 MG PO CAPS: 500.0000 mg | ORAL_CAPSULE | Freq: Three times a day (TID) | ORAL | 0 refills | Status: AC

## 2024-05-02 NOTE — ED Notes (Signed)
 Patient triage by provider Teresa Shelba SAUNDERS, NP

## 2024-05-02 NOTE — Discharge Instructions (Addendum)
 Your urinalysis shows Dennis Bond blood cells and nitrates which are indicative of infection, your urine will be sent to the lab to determine exactly which bacteria is present, if any changes need to be made to your medications you will be notified  Begin use of cephalexin  every 8 hours for 5 days  You may use Tylenol  every 6 hours as needed if you have a fever which is above 100.6 or you are having any abdominal or back pain  Increase your fluid intake through use of water  If symptoms continue to persist after use of medication or recur please follow-up with urgent care or your primary doctor as needed

## 2024-05-02 NOTE — ED Provider Notes (Signed)
 " CAY RALPH PELT    CSN: [id number] Arrival date & time: 05/02/24  1644      History   Chief Complaint No chief complaint on file.   HPI Dennis Bond is a 89 y.o. male.   Patient presents for evaluation of a fever peaking at 100.7, urinary frequency beginning today.  Has not attempted treatment.  History of overactive bladder, but this feels different.  Denies hematuria, dysuria, flank pain.       Past Medical History:  Diagnosis Date   Allergic rhinitis due to pollen    Arthritis    BPH (benign prostatic hyperplasia)    Chronic back pain    Coronary artery disease 09/05/2008   a.) LHC/PCI 09/05/2008: EF 78%, 95% pLAD, 95% mLAD, 90% RV marginal --> 2 overlapping stents (unknown type) placed to the p-mLAD   DDD (degenerative disc disease), lumbar    Diastolic dysfunction 11/20/2009   a.) TTE 11/20/2009: EF >55%, mild LVH, mild LAE, triv AR/MR/TR, G1DD   Hyperlipidemia    Hypertension    Insomnia    Lumbar spondylolysis    Medial epicondylitis    Osteoarthritis    Poor balance    PSVT (paroxysmal supraventricular tachycardia)    Urge incontinence     Patient Active Problem List   Diagnosis Date Noted   Gluteal pain 07/03/2019   Sacroiliitis 07/03/2019   Discogenic syndrome, 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   Arthritis of hand, right 07/01/2015   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   Displacement of intervertebral disc of lumbar region 12/06/2013   Chronic right-sided low back pain without sciatica 10/14/2013   Lateral epicondylitis 09/10/2012   Essential hypertension 07/31/2011   Coronary artery disease involving native coronary artery of native heart 10/05/2010   Mixed hyperlipidemia 10/05/2010   Paroxysmal supraventricular tachycardia 10/05/2010    Past Surgical History:  Procedure Laterality Date   BACK SURGERY     CAROTID STENT     09/05/2008   CYSTOSCOPY WITH INSERTION OF UROLIFT N/A 11/01/2021   Procedure: CYSTOSCOPY WITH  INSERTION OF UROLIFT;  Surgeon: Penne Knee, MD;  Location: [facility];  Service: Urology;  Laterality: N/A;       Home Medications    Prior to Admission medications  Medication Sig Start Date End Date Taking? Authorizing Provider  aspirin 81 MG EC tablet Take 81 mg by mouth daily.    [provider]  atorvastatin  (LIPITOR) 20 MG tablet Take 1 tablet (20 mg total) by mouth daily. 10/11/23   Gollan, Timothy J, MD  busPIRone (BUSPAR) 5 MG tablet Take 5 mg by mouth daily. 08/14/23 08/13/24  [provider]  lisinopril  (ZESTRIL ) 10 MG tablet Take 1 tablet (10 mg total) by mouth daily. 10/02/23   Gollan, Timothy J, MD  LORazepam (ATIVAN) 0.5 MG tablet Take 0.5 mg by mouth at bedtime. 09/25/23   [provider]  metoprolol  succinate (TOPROL -XL) 25 MG 24 hr tablet Take 1 tablet (25 mg total) by mouth daily. 10/02/23   Gollan, Timothy J, MD  Vibegron  (GEMTESA ) 75 MG TABS Take 1 tablet (75 mg total) by mouth daily. Patient not taking: Reported on 10/02/2023 01/24/23   Helon Clotilda LABOR, PA-C    Family History No family history on file.  Social History Social History[1]   Allergies   Sanctura  [trospium ]   Review of Systems Review of Systems   Physical Exam Triage Vital Signs ED Triage Vitals  Encounter Vitals Group     BP  Girls Systolic BP Percentile      Girls Diastolic BP Percentile      Boys Systolic BP Percentile      Boys Diastolic BP Percentile      Pulse      Resp      Temp      Temp src      SpO2      Weight      Height      Head Circumference      Peak Flow      Pain Score      Pain Loc      Pain Education      Exclude from Growth Chart    No data found.  Updated Vital Signs There were no vitals taken for this visit.  Visual Acuity Right Eye Distance:   Left Eye Distance:   Bilateral Distance:    Right Eye Near:   Left Eye Near:    Bilateral Near:     Physical Exam Constitutional:      Appearance: Normal appearance.   Eyes:     Extraocular Movements: Extraocular movements intact.  Pulmonary:     Effort: Pulmonary effort is normal.  Abdominal:     Tenderness: There is no abdominal tenderness. There is no right CVA tenderness, left CVA tenderness or guarding.  Neurological:     Mental Status: He is alert and oriented to person, place, and time. Mental status is at baseline.      UC Treatments / Results  Labs (all labs ordered are listed, but only abnormal results are displayed) Labs Reviewed  URINE CULTURE  POCT URINE DIPSTICK    EKG   Radiology No results found.  Procedures Procedures (including critical care time)  Medications Ordered in UC Medications - No data to display  Initial Impression / Assessment and Plan / UC Course  I have reviewed the triage vital signs and the nursing notes.  Pertinent labs & imaging results that were available during my care of the patient were reviewed by me and considered in my medical decision making (see chart for details).  Acute cystitis without hematuria, urinary frequency  Urinalysis showing nitrates and leukocytes, sent for culture, no prior culture available, initiating cephalexin , recommended supportive care advised use of Tylenol  for fever management and advised to follow-up if symptoms continue to persist or worsen Final Clinical Impressions(s) / UC Diagnoses   Final diagnoses:  Urinary frequency   Discharge Instructions   None    ED Prescriptions   None    PDMP not reviewed this encounter.     [1]  Social History Tobacco Use   Smoking status: Former   Smokeless tobacco: Never  Vaping Use   Vaping status: Never Used  Substance Use Topics   Alcohol use: Never   Drug use: Never     Teresa Shelba SAUNDERS, NP 05/02/24 1754  "

## 2024-05-07 ENCOUNTER — Ambulatory Visit: Admitting: Physical Therapy

## 2024-05-16 ENCOUNTER — Ambulatory Visit: Admitting: Physical Therapy

## 2024-06-13 ENCOUNTER — Ambulatory Visit: Admitting: Physical Therapy

## 2024-07-02 ENCOUNTER — Ambulatory Visit: Admitting: Cardiovascular Disease
